# Patient Record
Sex: Male | Born: 1940 | Race: White | Hispanic: No | Marital: Married | Smoking: Former smoker
Health system: Southern US, Community
[De-identification: ages and names within clinical notes are randomized; demographics above are authoritative.]

## PROBLEM LIST (undated history)

## (undated) DIAGNOSIS — I10 Essential (primary) hypertension: Secondary | ICD-10-CM

## (undated) DIAGNOSIS — I2510 Atherosclerotic heart disease of native coronary artery without angina pectoris: Secondary | ICD-10-CM

## (undated) DIAGNOSIS — F0390 Unspecified dementia without behavioral disturbance: Secondary | ICD-10-CM

## (undated) DIAGNOSIS — E119 Type 2 diabetes mellitus without complications: Secondary | ICD-10-CM

## (undated) DIAGNOSIS — I639 Cerebral infarction, unspecified: Secondary | ICD-10-CM

## (undated) HISTORY — PX: CARPAL TUNNEL RELEASE: SHX101

## (undated) HISTORY — PX: BACK SURGERY: SHX140

---

## 2014-04-17 ENCOUNTER — Ambulatory Visit
Admit: 2014-04-17 | Disposition: A | Discharge status: Home / Self Care | Payer: Self-pay | Attending: Internal Medicine | Admitting: Internal Medicine

## 2015-03-20 ENCOUNTER — Ambulatory Visit
Admission: EM | Admit: 2015-03-20 | Discharge: 2015-03-20 | Disposition: A | Discharge status: Home / Self Care | Payer: Medicare Other | Attending: Family Medicine | Admitting: Family Medicine

## 2015-03-20 ENCOUNTER — Encounter: Payer: Self-pay | Admitting: *Deleted

## 2015-03-20 DIAGNOSIS — E1122 Type 2 diabetes mellitus with diabetic chronic kidney disease: Secondary | ICD-10-CM | POA: Diagnosis not present

## 2015-03-20 DIAGNOSIS — N189 Chronic kidney disease, unspecified: Secondary | ICD-10-CM | POA: Insufficient documentation

## 2015-03-20 DIAGNOSIS — Z87891 Personal history of nicotine dependence: Secondary | ICD-10-CM | POA: Diagnosis not present

## 2015-03-20 DIAGNOSIS — R0981 Nasal congestion: Secondary | ICD-10-CM | POA: Diagnosis present

## 2015-03-20 DIAGNOSIS — I2510 Atherosclerotic heart disease of native coronary artery without angina pectoris: Secondary | ICD-10-CM | POA: Insufficient documentation

## 2015-03-20 DIAGNOSIS — I129 Hypertensive chronic kidney disease with stage 1 through stage 4 chronic kidney disease, or unspecified chronic kidney disease: Secondary | ICD-10-CM | POA: Insufficient documentation

## 2015-03-20 DIAGNOSIS — B349 Viral infection, unspecified: Secondary | ICD-10-CM | POA: Insufficient documentation

## 2015-03-20 DIAGNOSIS — J029 Acute pharyngitis, unspecified: Secondary | ICD-10-CM | POA: Diagnosis not present

## 2015-03-20 HISTORY — DX: Atherosclerotic heart disease of native coronary artery without angina pectoris: I25.10

## 2015-03-20 HISTORY — DX: Type 2 diabetes mellitus without complications: E11.9

## 2015-03-20 HISTORY — DX: Essential (primary) hypertension: I10

## 2015-03-20 LAB — RAPID INFLUENZA A&B ANTIGENS (ARMC ONLY): INFLUENZA B (ARMC): NEGATIVE

## 2015-03-20 LAB — RAPID INFLUENZA A&B ANTIGENS: Influenza A (ARMC): NEGATIVE

## 2015-03-20 LAB — RAPID STREP SCREEN (MED CTR MEBANE ONLY): STREPTOCOCCUS, GROUP A SCREEN (DIRECT): NEGATIVE

## 2015-03-20 MED ORDER — OSELTAMIVIR PHOSPHATE 30 MG PO CAPS: 30.0000 mg | ORAL_CAPSULE | Freq: Two times a day (BID) | ORAL | Status: AC

## 2015-03-20 NOTE — Discharge Instructions (Signed)
Take medication as prescribed. Rest. Drink plenty of fluids.   Follow up with your primary care physician this week as needed. Return to Urgent care for new or worsening concerns.    Viral Infections A viral infection can be caused by different types of viruses.Most viral infections are not serious and resolve on their own. However, some infections may cause severe symptoms and may lead to further complications. SYMPTOMS Viruses can frequently cause:  Minor sore throat.  Aches and pains.  Headaches.  Runny nose.  Different types of rashes.  Watery eyes.  Tiredness.  Cough.  Loss of appetite.  Gastrointestinal infections, resulting in nausea, vomiting, and diarrhea. These symptoms do not respond to antibiotics because the infection is not caused by bacteria. However, you might catch a bacterial infection following the viral infection. This is sometimes called a "superinfection." Symptoms of such a bacterial infection may include:  Worsening sore throat with pus and difficulty swallowing.  Swollen neck glands.  Chills and a high or persistent fever.  Severe headache.  Tenderness over the sinuses.  Persistent overall ill feeling (malaise), muscle aches, and tiredness (fatigue).  Persistent cough.  Yellow, green, or brown mucus production with coughing. HOME CARE INSTRUCTIONS   Only take over-the-counter or prescription medicines for pain, discomfort, diarrhea, or fever as directed by your caregiver.  Drink enough water and fluids to keep your urine clear or pale yellow. Sports drinks can provide valuable electrolytes, sugars, and hydration.  Get plenty of rest and maintain proper nutrition. Soups and broths with crackers or rice are fine. SEEK IMMEDIATE MEDICAL CARE IF:   You have severe headaches, shortness of breath, chest pain, neck pain, or an unusual rash.  You have uncontrolled vomiting, diarrhea, or you are unable to keep down fluids.  You or your child  has an oral temperature above 102 F (38.9 C), not controlled by medicine.  Your baby is older than 3 months with a rectal temperature of 102 F (38.9 C) or higher.  Your baby is 18 months old or younger with a rectal temperature of 100.4 F (38 C) or higher. MAKE SURE YOU:   Understand these instructions.  Will watch your condition.  Will get help right away if you are not doing well or get worse.   This information is not intended to replace advice given to you by your health care provider. Make sure you discuss any questions you have with your health care provider.   Document Released: 10/10/2004 Document Revised: 03/25/2011 Document Reviewed: 06/08/2014 Elsevier Interactive Patient Education [employer].

## 2015-03-20 NOTE — ED Provider Notes (Signed)
[facility]  ____________________________________________  Time seen: Approximately 8:18 PM  I have reviewed the triage vital signs and the nursing notes.   HISTORY  Chief Complaint Nasal Congestion; Sore Throat; Fatigue; and Cough  HPI Per Notaro is a 75 y.o. male presents with wife at bedside for the complaints of 1 day history of runny nose, nasal congestion, cough, sore throat with chills since yesterday afternoon. Patient reports that just prior to symptom onset he was around a friend that was diagnosed positive influenza. Patient states that he has been in close contact with a friend including driving to and from [place] just prior to his symptom onset. Patient reports that his friend was diagnosed positive within the last few days. Denies known fevers but reports that he is felt that he had a fever. Reports continues to eat and drink well. States has been taking over-the-counter Tylenol today. Denies other complaints.  Denies chest pain, shortness of breath, abdominal pain, dizziness, neck or back pain, weakness or rash. Denies dysuria.   Past Medical History  Diagnosis Date  . Coronary artery disease   . Diabetes mellitus without complication ([place])   . Hypertension    chronic renal insufficiency  There are no active problems to display for this patient.   Past Surgical History  Procedure Laterality Date  . Back surgery    . Carpal tunnel release Bilateral     Current Outpatient Rx  Name  Route  Sig  Dispense  Refill  . metoprolol succinate (TOPROL-XL) 25 MG 24 hr tablet   Oral   Take 25 mg by mouth daily.         . rivaroxaban (XARELTO) 10 MG TABS tablet   Oral   Take 10 mg by mouth daily.         Marland Kitchen torsemide (DEMADEX) 20 MG tablet   Oral   Take 20 mg by mouth 2 (two) times daily.         .             Allergies Codeine  History reviewed. No pertinent family history.  Social History Social History  Substance Use Topics  .  Smoking status: Former Research scientist (life sciences)  . Smokeless tobacco: None  . Alcohol Use: Yes    Review of Systems Constitutional: Positive chills. Eyes: No visual changes. ENT: Positive runny nose, nasal congestion, sore throat and cough. Cardiovascular: Denies chest pain. Respiratory: Denies shortness of breath. Gastrointestinal: No abdominal pain.  No nausea, no vomiting.  No diarrhea.  No constipation. Genitourinary: Negative for dysuria. Musculoskeletal: Negative for back pain. Skin: Negative for rash. Neurological: Negative for headaches, focal weakness or numbness.  10-point ROS otherwise negative.  ____________________________________________   PHYSICAL EXAM:  VITAL SIGNS: ED Triage Vitals  Enc Vitals Group     BP 03/20/15 1858 155/86 mmHg     Pulse Rate 03/20/15 1858 79     Resp 03/20/15 1858 20     Temp 03/20/15 1858 98.9 F (37.2 C)     Temp Source 03/20/15 1858 Oral     SpO2 03/20/15 1858 97 %     Weight 03/20/15 1858 209 lb (94.802 kg)     Height 03/20/15 1858 5\' 9"  (1.753 m)     Head Cir --      Peak Flow --      Pain Score --      Pain Loc --      Pain Edu? --      Excl. in [place]? --  Constitutional: Alert and oriented. Well appearing and in no acute distress. Eyes: Conjunctivae are normal. PERRL. EOMI. Head: Atraumatic. No sinus tenderness to palpation. No swelling. No erythema.  Ears: no erythema, normal TMs bilaterally.   Nose:Nasal congestion with clear rhinorrhea  Mouth/Throat: Mucous membranes are moist. Mild pharyngeal erythema. No tonsillar swelling or exudate.  Neck: No stridor.  No cervical spine tenderness to palpation. Hematological/Lymphatic/Immunilogical: No cervical lymphadenopathy. Cardiovascular: Normal rate, regular rhythm. Grossly normal heart sounds.  Good peripheral circulation. Respiratory: Normal respiratory effort.  No retractions. Lungs CTAB.No wheezes, rales or rhonchi. Good air movement.  Gastrointestinal: Soft and nontender. Normal Bowel  sounds. No CVA tenderness. Musculoskeletal: No lower or upper extremity tenderness nor edema. No cervical, thoracic or lumbar tenderness to palpation. Bilateral pedal pulses palpated and equal. Neurologic:  Normal speech and language. No gross focal neurologic deficits are appreciated. No gait instability. Skin:  Skin is warm, dry and intact. No rash noted. Psychiatric: Mood and affect are normal. Speech and behavior are normal.  ____________________________________________   LABS (all labs ordered are listed, but only abnormal results are displayed)  Labs Reviewed  RAPID INFLUENZA A&B ANTIGENS ([facility] ONLY)  RAPID STREP SCREEN (NOT AT [facility])  CULTURE, GROUP A STREP [facility])    INITIAL IMPRESSION / ASSESSMENT AND PLAN / ED COURSE  Pertinent labs & imaging results that were available during my care of the patient were reviewed by me and considered in my medical decision making (see chart for details).   Very well-appearing patient. No acute distress. Wife at bedside. One day history of runny nose, nasal congestion, sore throat and cough with positive influenza exposure just prior to symptom onset. Lungs clear throughout. Abdomen soft and nontender. Very well-appearing. Moist mucous membranes. Quick strep negative, will culture. Influenza negative. However patient's subjective information and clinical appearance as well as positive influenza exposure just prior to symptom onset suggestive of influenza. Discussed this with patient and spouse. Discussed treatment with oral Tamiflu with patient and spouse and they request prescription for oral Tamiflu. Discussed indication, risks and benefits of medications with patient. Patient with chronic renal insufficiency, will treat with renal dose of Tamiflu. Labs in epic reviewed. Tamiflu 30 mg twice a day 5 days. Encouraged rest, fluids, when necessary Tylenol as needed. Encourage close monitoring of symptoms and PCP follow up.    Discussed follow up with  Primary care physician this week. Discussed follow up and return parameters including no resolution or any worsening concerns. Patient verbalized understanding and agreed to plan.   ____________________________________________   FINAL CLINICAL IMPRESSION(S) / ED DIAGNOSES  Final diagnoses:  Viral illness      Note: This dictation was prepared with Dragon dictation along with smaller phrase technology. Any transcriptional errors that result from this process are unintentional.    Marylene Land, NP 03/20/15 2027

## 2015-03-20 NOTE — ED Notes (Signed)
Exposed to a person recently dx with flu, now c/o fatigue, non-productive cough, runny nose, and sore throat since yesterday.

## 2015-03-23 LAB — CULTURE, GROUP A STREP (THRC)

## 2016-12-25 ENCOUNTER — Ambulatory Visit
Admission: RE | Admit: 2016-12-25 | Discharge: 2016-12-25 | Disposition: A | Discharge status: Home / Self Care | Payer: Medicare Other | Source: Ambulatory Visit | Attending: Family Medicine | Admitting: Family Medicine

## 2016-12-25 ENCOUNTER — Other Ambulatory Visit: Payer: Self-pay | Admitting: Family Medicine

## 2016-12-25 DIAGNOSIS — M7122 Synovial cyst of popliteal space [Baker], left knee: Secondary | ICD-10-CM | POA: Insufficient documentation

## 2016-12-25 DIAGNOSIS — M7989 Other specified soft tissue disorders: Secondary | ICD-10-CM | POA: Insufficient documentation

## 2016-12-27 ENCOUNTER — Other Ambulatory Visit: Payer: Self-pay | Admitting: Sports Medicine

## 2016-12-27 DIAGNOSIS — M7122 Synovial cyst of popliteal space [Baker], left knee: Secondary | ICD-10-CM

## 2016-12-30 ENCOUNTER — Ambulatory Visit
Admission: RE | Admit: 2016-12-30 | Discharge: 2016-12-30 | Disposition: A | Discharge status: Home / Self Care | Payer: Medicare Other | Source: Ambulatory Visit | Attending: Sports Medicine | Admitting: Sports Medicine

## 2016-12-30 DIAGNOSIS — M7122 Synovial cyst of popliteal space [Baker], left knee: Secondary | ICD-10-CM | POA: Diagnosis not present

## 2016-12-30 MED ORDER — METHYLPREDNISOLONE ACETATE 80 MG/ML IJ SUSP
INTRAMUSCULAR | Status: AC
  Filled 2016-12-30: qty 2

## 2016-12-30 MED ORDER — METHYLPREDNISOLONE ACETATE 40 MG/ML INJ SUSP (RADIOLOG
120.0000 mg | Freq: Once | INTRAMUSCULAR | Status: AC
  Administered 2016-12-30: 120 mg via INTRA_ARTICULAR

## 2016-12-30 MED ORDER — BUPIVACAINE HCL (PF) 0.25 % IJ SOLN
INTRAMUSCULAR | Status: AC
  Filled 2016-12-30: qty 30

## 2016-12-30 MED ORDER — BUPIVACAINE HCL (PF) 0.25 % IJ SOLN
7.0000 mL | Freq: Once | INTRAMUSCULAR | Status: AC
  Administered 2016-12-30: 7 mL
  Filled 2016-12-30: qty 10

## 2017-09-19 ENCOUNTER — Other Ambulatory Visit: Payer: Self-pay

## 2017-09-19 ENCOUNTER — Encounter: Payer: Self-pay | Admitting: Emergency Medicine

## 2017-09-19 ENCOUNTER — Ambulatory Visit: Payer: Medicare Other

## 2017-09-19 ENCOUNTER — Ambulatory Visit
Admission: EM | Admit: 2017-09-19 | Discharge: 2017-09-19 | Disposition: A | Discharge status: Home / Self Care | Payer: Medicare Other | Attending: Physician Assistant | Admitting: Physician Assistant

## 2017-09-19 DIAGNOSIS — Z885 Allergy status to narcotic agent status: Secondary | ICD-10-CM | POA: Insufficient documentation

## 2017-09-19 DIAGNOSIS — R05 Cough: Secondary | ICD-10-CM | POA: Diagnosis not present

## 2017-09-19 DIAGNOSIS — R059 Cough, unspecified: Secondary | ICD-10-CM

## 2017-09-19 DIAGNOSIS — J209 Acute bronchitis, unspecified: Secondary | ICD-10-CM | POA: Diagnosis not present

## 2017-09-19 DIAGNOSIS — Z79899 Other long term (current) drug therapy: Secondary | ICD-10-CM | POA: Insufficient documentation

## 2017-09-19 DIAGNOSIS — I10 Essential (primary) hypertension: Secondary | ICD-10-CM | POA: Insufficient documentation

## 2017-09-19 DIAGNOSIS — Z87891 Personal history of nicotine dependence: Secondary | ICD-10-CM | POA: Insufficient documentation

## 2017-09-19 DIAGNOSIS — J069 Acute upper respiratory infection, unspecified: Secondary | ICD-10-CM

## 2017-09-19 DIAGNOSIS — Z7901 Long term (current) use of anticoagulants: Secondary | ICD-10-CM | POA: Insufficient documentation

## 2017-09-19 DIAGNOSIS — E119 Type 2 diabetes mellitus without complications: Secondary | ICD-10-CM | POA: Diagnosis not present

## 2017-09-19 DIAGNOSIS — B9789 Other viral agents as the cause of diseases classified elsewhere: Secondary | ICD-10-CM | POA: Diagnosis not present

## 2017-09-19 DIAGNOSIS — I2510 Atherosclerotic heart disease of native coronary artery without angina pectoris: Secondary | ICD-10-CM | POA: Insufficient documentation

## 2017-09-19 MED ORDER — PREDNISONE 10 MG PO TABS: 30.0000 mg | ORAL_TABLET | Freq: Every day | ORAL | 0 refills | Status: AC

## 2017-09-19 MED ORDER — HYDROCOD POLST-CPM POLST ER 10-8 MG/5ML PO SUER: 5.0000 mL | Freq: Two times a day (BID) | ORAL | 0 refills | Status: AC | PRN

## 2017-09-19 NOTE — ED Triage Notes (Signed)
Pt c/o cough and congestion. Started over a week ago. Productive cough with yellow and bloody sputum. He is wheezing.

## 2017-09-19 NOTE — ED Provider Notes (Signed)
[facility]    CSN: [id number] Arrival date & time: 09/19/17  1510     History   Chief Complaint Chief Complaint  Patient presents with  . Cough    HPI Eduardo Richards is a 77 y.o. male. Patient presents today with his wife for productive cough of greater than one week. He states that his sputum is yellow-green and he noticed specks of blood last night. Denies chest pain, shortness of breath and breathing difficulty. Denies fever, fatigue, nasal congestion, sinus pain, and body aches. Wife states that he has been wheezing at night. Patient has no history of COPD, asthma or CHF. He is diabetic and has CAD. He has take OTC Claritin for symptoms without relief. He has no other concerns or complaints today.  HPI  Past Medical History:  Diagnosis Date  . Coronary artery disease   . Diabetes mellitus without complication ([place])   . Hypertension     There are no active problems to display for this patient.   Past Surgical History:  Procedure Laterality Date  . BACK SURGERY    . CARPAL TUNNEL RELEASE Bilateral        Home Medications    Prior to Admission medications   Medication Sig Start Date End Date Taking? Authorizing Provider  metoprolol succinate (TOPROL-XL) 25 MG 24 hr tablet Take 25 mg by mouth daily.   Yes [provider]  torsemide (DEMADEX) 20 MG tablet Take 20 mg by mouth 2 (two) times daily.   Yes [provider]  chlorpheniramine-HYDROcodone (TUSSIONEX PENNKINETIC ER) 10-8 MG/5ML SUER Take 5 mLs by mouth every 12 (twelve) hours as needed for up to 10 days for cough. 09/19/17 09/29/17  Danton Clap, PA-C  predniSONE (DELTASONE) 10 MG tablet Take 3 tablets (30 mg total) by mouth daily with breakfast for 5 days. 09/19/17 09/24/17  Danton Clap, PA-C  rivaroxaban (XARELTO) 10 MG TABS tablet Take 10 mg by mouth daily.    [provider]  warfarin (COUMADIN) 4 MG tablet Take by mouth.    [provider]    Family  History Family History  Problem Relation Age of Onset  . Cancer Mother   . Cancer Father     Social History Social History   Tobacco Use  . Smoking status: Former Research scientist (life sciences)  . Smokeless tobacco: Never Used  Substance Use Topics  . Alcohol use: Yes    Comment: occasionally  . Drug use: Never     Allergies   Codeine   Review of Systems Review of Systems  Constitutional: Negative for appetite change, chills, fatigue and fever.  HENT: Negative for congestion, ear pain, rhinorrhea, sinus pressure and sinus pain.   Respiratory: Positive for cough and wheezing. Negative for apnea, chest tightness and shortness of breath.   Cardiovascular: Negative for chest pain and leg swelling.  Gastrointestinal: Negative for abdominal pain, nausea and vomiting.  Musculoskeletal: Negative for arthralgias and myalgias.  Skin: Negative for rash.  Neurological: Negative for dizziness.  Hematological: Negative for adenopathy.     Physical Exam Triage Vital Signs ED Triage Vitals  Enc Vitals Group     BP 09/19/17 1612 (!) 151/87     Pulse Rate 09/19/17 1612 71     Resp 09/19/17 1612 17     Temp 09/19/17 1612 98.6 F (37 C)     Temp Source 09/19/17 1612 Oral     SpO2 09/19/17 1612 95 %     Weight 09/19/17 1611 198  lb (89.8 kg)     Height 09/19/17 1611 5\' 9"  (1.753 m)     Head Circumference --      Peak Flow --      Pain Score 09/19/17 1610 0     Pain Loc --      Pain Edu? --      Excl. in [place]? --    No data found.  Updated Vital Signs BP (!) 151/87 (BP Location: Left Arm)   Pulse 71   Temp 98.6 F (37 C) (Oral)   Resp 17   Ht 5\' 9"  (1.753 m)   Wt 198 lb (89.8 kg)   SpO2 95%   BMI 29.24 kg/m       Physical Exam  Constitutional: He is oriented to person, place, and time. He appears well-developed and well-nourished. No distress.  HENT:  Head: Normocephalic and atraumatic.  Right Ear: External ear normal.  Left Ear: External ear normal.  Nose: Nose normal.  Mouth/Throat:  Oropharynx is clear and moist.  Eyes: Pupils are equal, round, and reactive to light. Conjunctivae and EOM are normal. No scleral icterus.  Neck: Normal range of motion. Neck supple.  Cardiovascular: Normal rate, regular rhythm and normal heart sounds.  No murmur heard. Pulmonary/Chest: Effort normal and breath sounds normal. No respiratory distress. He has no wheezes. He has no rales.  Lymphadenopathy:    He has no cervical adenopathy.  Neurological: He is alert and oriented to person, place, and time.  Skin: Skin is warm and dry. He is not diaphoretic. No erythema.  Psychiatric: He has a normal mood and affect. His behavior is normal.     UC Treatments / Results  Labs (all labs ordered are listed, but only abnormal results are displayed) Labs Reviewed - No data to display  EKG None  Radiology Dg Chest 2 View  Result Date: 09/19/2017 CLINICAL DATA:  Cough for 2 weeks EXAM: CHEST - 2 VIEW COMPARISON:  None FINDINGS: Normal heart size, mediastinal contours, and pulmonary vascularity. Atherosclerotic calcifications aorta. Lungs low expanded and clear. No pulmonary infiltrate, pleural effusion or pneumothorax. Bones unremarkable. IMPRESSION: No acute abnormalities. Electronically Signed   By: Lavonia Dana M.D.   On: 09/19/2017 18:15    Procedures Procedures (including critical care time)  Medications Ordered in UC Medications - No data to display  Initial Impression / Assessment and Plan / UC Course  I have reviewed the triage vital signs and the nursing notes.  Pertinent labs & imaging results that were available during my care of the patient were reviewed by me and considered in my medical decision making (see chart for details).   Oxygen saturation slightly decreased at 95%. On exam, lungs CTA, but given concern by patient and wife, CXR performed to assess for underlying pneumonia. CXR normal. Patient treated for viral infection. Patient and wife requesting cough medication for  bed time since the cough is keeping him awake. Advised Coricidin HBP during day and tussionex at bed time--use responsibly and do not drive while taking. Begin prednisone today as well. Rest and increase fluids. He should f/u with his  PCP next week for re-examination or sooner for any fever, breathing difficulty or signs of worsening infection.    Final Clinical Impressions(s) / UC Diagnoses   Final diagnoses:  Cough  Viral upper respiratory tract infection  Acute bronchitis, unspecified organism     Discharge Instructions     Your chest x-ray today was normal. There was no sign of pneumonia.  Your lungs were clear on exam. Your condition is consistent with a viral nfection. This is self limited and should resolve over the next 7-10 days. Begin Tussionex twice daily. Do not take if you are going to be driving. Increase fluid intake. Take prednisone to further reduce inflammation and improve your condition. If you have fever, wheezing, breathing difficulty, fatigue, or feel worse in anyway f/u with our office, your PCP or ER    ED Prescriptions    Medication Sig Dispense Auth. Provider   chlorpheniramine-HYDROcodone (TUSSIONEX PENNKINETIC ER) 10-8 MG/5ML SUER Take 5 mLs by mouth every 12 (twelve) hours as needed for up to 10 days for cough. 100 mL Laurene Footman B, PA-C   predniSONE (DELTASONE) 10 MG tablet Take 3 tablets (30 mg total) by mouth daily with breakfast for 5 days. 15 tablet Danton Clap, PA-C     Controlled Substance Prescriptions [place] Controlled Substance Registry consulted? Yes, I have consulted the [place] Controlled Substances Registry for this patient, and feel the risk/benefit ratio today is favorable for proceeding with this prescription for a controlled substance.   Danton Clap, PA-C 09/20/17 1902

## 2017-09-19 NOTE — Discharge Instructions (Addendum)
Your chest x-ray today was normal. There was no sign of pneumonia. Your lungs were clear on exam. Your condition is consistent with a viral nfection. This is self limited and should resolve over the next 7-10 days. Begin Tussionex twice daily. Do not take if you are going to be driving. Increase fluid intake. Take prednisone to further reduce inflammation and improve your condition. If you have fever, wheezing, breathing difficulty, fatigue, or feel worse in anyway f/u with our office, your PCP or ER

## 2018-08-17 ENCOUNTER — Other Ambulatory Visit: Payer: Self-pay

## 2018-08-17 ENCOUNTER — Ambulatory Visit (INDEPENDENT_AMBULATORY_CARE_PROVIDER_SITE_OTHER): Payer: Medicare Other | Admitting: Urology

## 2018-08-17 ENCOUNTER — Encounter: Payer: Self-pay | Admitting: Urology

## 2018-08-17 VITALS — BP 151/83 | HR 78 | Ht 69.0 in | Wt 198.0 lb

## 2018-08-17 DIAGNOSIS — R310 Gross hematuria: Secondary | ICD-10-CM | POA: Diagnosis not present

## 2018-08-17 LAB — MICROSCOPIC EXAMINATION: WBC, UA: >30 /hpf — AB (ref 0–5)

## 2018-08-17 LAB — URINALYSIS, COMPLETE
Bilirubin, UA: NEGATIVE
Glucose, UA: NEGATIVE
Ketones, UA: NEGATIVE
Nitrite, UA: POSITIVE — AB
Specific Gravity, UA: 1.02 (ref 1.005–1.030)
Urobilinogen, Ur: 0.2 mg/dL (ref 0.2–1.0)
pH, UA: 7 (ref 5.0–7.5)

## 2018-08-17 MED ORDER — AMOXICILLIN-POT CLAVULANATE 875-125 MG PO TABS: 1.0000 | ORAL_TABLET | Freq: Two times a day (BID) | ORAL | 0 refills | Status: DC

## 2018-08-17 NOTE — Addendum Note (Signed)
Addended by: Verlene Mayer A on: 08/17/2018 01:16 PM   Modules accepted: Orders

## 2018-08-17 NOTE — Progress Notes (Signed)
08/17/2018 11:40 AM   Eduardo Richards November 25, 1940 [id number]  Referring provider: [name], [place],  [place] [address]  Chief Complaint  Patient presents with  . Hematuria    HPI: The patient was followed at [facility] and was on Myrbetriq the Oxytrol patch and finasteride.  He had urodynamics in 2011.  He had been on every 3 months maintenance therapy of percutaneous tibial nerve stimulation.  According to medical records he had advancing dementia and has had a stroke.  There was question whether or not Botox could be utilized in the future and perhaps sent to [facility]  The patient recently saw his primary care physician for for 5-day history of gross hematuria.  The history was difficult.  The patient is on Myrbetriq and Proscar but not the patch.  Percutaneous tibial nerve stimulation was stopped due to lack of efficacy.  He does have advancing dementia.  He does take daily blood thinners with Coumadin and aspirin.  He has never smoked.  Based upon the history of a urine culture he was recently started on Keflex but never started it yet  Sometimes he voluntarily voids and other times he just uses depends.  He can walk but was in a wheelchair today.  History was primarily obtained through his wife  Modifying factors: There are no other modifying factors  Associated signs and symptoms: There are no other associated signs and symptoms Aggravating and relieving factors: There are no other aggravating or relieving factors Severity: Moderate Duration: Persistent     PMH: Past Medical History:  Diagnosis Date  . Coronary artery disease   . Diabetes mellitus without complication ([place])   . Hypertension     Surgical History: Past Surgical History:  Procedure Laterality Date  . BACK SURGERY    . CARPAL TUNNEL RELEASE Bilateral     Home Medications:  Allergies as of 08/17/2018      Reactions   Clindamycin Rash   Glipizide Other (See  Comments)   Reported confusion while taking the medication   Tamsulosin Other (See Comments)   ED ED ED ED   Codeine Nausea And Vomiting      Medication List       Accurate as of August 17, 2018 11:40 AM. If you have any questions, ask your nurse or doctor.        STOP taking these medications   rivaroxaban 10 MG Tabs tablet Commonly known as: XARELTO Stopped by: [name], MD     TAKE these medications   allopurinol 100 MG tablet Commonly known as: ZYLOPRIM Take by mouth.   amoxicillin-clavulanate 875-125 MG tablet Commonly known as: AUGMENTIN Take 1 tablet by mouth every 12 (twelve) hours. Started by: [name], MD   aspirin 81 MG chewable tablet Chew by mouth.   atorvastatin 40 MG tablet Commonly known as: LIPITOR Take by mouth.   buPROPion 150 MG 24 hr tablet Commonly known as: WELLBUTRIN XL Take by mouth.   diltiazem 180 MG 24 hr capsule Commonly known as: CARDIZEM CD Take by mouth.   finasteride 5 MG tablet Commonly known as: PROSCAR Take by mouth.   ketoconazole 2 % shampoo Commonly known as: NIZORAL APPLY ON THE SKIN DAILY TO [facility] UPPER BODY AND SCALP X 2 WEEKS THEN ONCE A MONTH AS MAINTENCE   metoprolol succinate 25 MG 24 hr tablet Commonly known as: TOPROL-XL Take 25 mg by mouth daily.   Myrbetriq 50 MG Tb24 tablet  Generic drug: mirabegron ER TAKE 1 TABLET BY MOUTH EVERY DAY   torsemide 20 MG tablet Commonly known as: DEMADEX Take 20 mg by mouth 2 (two) times daily.   Tradjenta 5 MG Tabs tablet Generic drug: linagliptin Take 5 mg by mouth daily.   warfarin 4 MG tablet Commonly known as: COUMADIN Take by mouth.       Allergies:  Allergies  Allergen Reactions  . Clindamycin Rash  . Glipizide Other (See Comments)    Reported confusion while taking the medication  . Tamsulosin Other (See Comments)    ED ED ED ED   . Codeine Nausea And Vomiting    Family History: Family History  Problem Relation Age  of Onset  . Cancer Mother   . Cancer Father     Social History:  reports that he has quit smoking. He has never used smokeless tobacco. He reports current alcohol use. He reports that he does not use drugs.  ROS: UROLOGY Frequent Urination?: No Hard to postpone urination?: Yes Burning/pain with urination?: No Get up at night to urinate?: No Leakage of urine?: No Urine stream starts and stops?: Yes Trouble starting stream?: No Do you have to strain to urinate?: No Blood in urine?: Yes Urinary tract infection?: Yes Sexually transmitted disease?: No Injury to kidneys or bladder?: No Painful intercourse?: No Weak stream?: No Erection problems?: No Penile pain?: No  Gastrointestinal Nausea?: No Vomiting?: No Indigestion/heartburn?: No Diarrhea?: No Constipation?: No  Constitutional Fever: No Night sweats?: No Weight loss?: No Fatigue?: No  Skin Skin rash/lesions?: No Itching?: No  Eyes Blurred vision?: No Double vision?: No  Ears/Nose/Throat Sore throat?: No Sinus problems?: No  Hematologic/Lymphatic Swollen glands?: No Easy bruising?: No  Cardiovascular Leg swelling?: No Chest pain?: No  Respiratory Cough?: No Shortness of breath?: No  Endocrine Excessive thirst?: No  Musculoskeletal Back pain?: No Joint pain?: No  Neurological Headaches?: No  Psychologic Depression?: No Anxiety?: No  Physical Exam: BP (!) 151/83   Pulse 78   Ht 5\' 9"  (1.753 m)   Wt 198 lb (89.8 kg)   BMI 29.24 kg/m   Constitutional:  Alert and oriented, No acute distress. HEENT: [place] AT, moist mucus membranes.  Trachea midline, no masses. Cardiovascular: No clubbing, cyanosis, or edema. Respiratory: Normal respiratory effort, no increased work of breathing. GI: Abdomen is soft, nontender, nondistended, no abdominal masses GU: No CVA tenderness.  No bladder tenderness Skin: No rashes, bruises or suspicious lesions. Lymph: No cervical or inguinal adenopathy.  Neurologic: Grossly intact, no focal deficits, moving all 4 extremities. Psychiatric: Normal mood and affect.  Laboratory Data: No results found for: WBC, HGB, HCT, MCV, PLT  No results found for: CREATININE  No results found for: PSA  No results found for: TESTOSTERONE  No results found for: HGBA1C  Urinalysis No results found for: COLORURINE, APPEARANCEUR, LABSPEC, PHURINE, GLUCOSEU, HGBUR, BILIRUBINUR, KETONESUR, PROTEINUR, UROBILINOGEN, NITRITE, LEUKOCYTESUR  Pertinent Imaging: No recent x-rays  Assessment & Plan: We tried to obtain the urine culture results.  I told him to fill the Keflex prescription.  I think it would be best to order a CT scan hematuria work-up and call if the results are abnormal.    We did fine with a positive culture and Keflex was not appropriate.  We called in Augmentin.  Hematuria CT scan ordered I will call if needed.  Otherwise see me in 6 weeks.  They would like to resume follow-up here.  We did speak to Botox and we will  not be offering this in the future.  He does not need cystoscopy at this stage.  He has exhausted his treatment algorithm in my opinion  There are no diagnoses linked to this encounter.  Return in about 6 weeks (around 09/28/2018) for MD follow up.  [name], MD  [place] [address] [place], [place] [address] [phone]

## 2018-08-19 ENCOUNTER — Other Ambulatory Visit: Payer: Self-pay

## 2018-08-19 DIAGNOSIS — R310 Gross hematuria: Secondary | ICD-10-CM

## 2018-08-24 ENCOUNTER — Other Ambulatory Visit: Payer: Self-pay | Admitting: Urology

## 2018-08-24 ENCOUNTER — Other Ambulatory Visit
Admission: RE | Admit: 2018-08-24 | Discharge: 2018-08-24 | Disposition: A | Discharge status: Home / Self Care | Payer: Medicare Other | Source: Ambulatory Visit | Attending: Urology | Admitting: Urology

## 2018-08-24 ENCOUNTER — Other Ambulatory Visit: Payer: Self-pay

## 2018-08-24 ENCOUNTER — Ambulatory Visit
Admission: RE | Admit: 2018-08-24 | Discharge: 2018-08-24 | Disposition: A | Discharge status: Home / Self Care | Payer: Medicare Other | Source: Ambulatory Visit | Attending: Urology | Admitting: Urology

## 2018-08-24 DIAGNOSIS — R310 Gross hematuria: Secondary | ICD-10-CM

## 2018-08-24 LAB — CREATININE, SERUM
Creatinine, Ser: 2.77 mg/dL — ABNORMAL HIGH (ref 0.61–1.24)
GFR calc Af Amer: 24 mL/min — ABNORMAL LOW (ref >60)
GFR calc non Af Amer: 21 mL/min — ABNORMAL LOW (ref >60)

## 2018-08-24 LAB — BUN: BUN: 39 mg/dL — ABNORMAL HIGH (ref 8–23)

## 2018-08-25 ENCOUNTER — Telehealth: Payer: Self-pay | Admitting: Urology

## 2018-08-25 NOTE — Telephone Encounter (Signed)
Patient's wife is calling asking for his CT results and she is not listed on any kind of DPR as a contact per to speak with. He has dementia and they are not able to come in and sign a form right now and we did not give the form at check in to fill out. Can you review the results and let us know if we can give them to her. She is coming to the [facility] office 08-26-18 to sign one so that way we can speak with her.   Thanks, Sharyn Lull

## 2018-08-27 NOTE — Telephone Encounter (Signed)
You may provide him to the family as long as it meets the criteria of confidence she allergy

## 2018-08-28 ENCOUNTER — Telehealth: Payer: Self-pay | Admitting: Urology

## 2018-08-28 ENCOUNTER — Other Ambulatory Visit: Payer: Self-pay | Admitting: Urology

## 2018-08-28 DIAGNOSIS — N281 Cyst of kidney, acquired: Secondary | ICD-10-CM

## 2018-08-28 NOTE — Telephone Encounter (Signed)
I have spoken with Mrs. Groesbeck regarding her husband's CT result and she would like the results sent to her husband's nephrologist.  She will come to the [facility] office either today or Monday to sign a Medical release form.  I did explain that the areas in his kidneys could not be readily identified because contrast could not be used and they were likely benign cysts, but that we will order a RUS in 6 months for monitoring purposes.

## 2018-09-08 ENCOUNTER — Ambulatory Visit: Payer: Self-pay | Admitting: Urology

## 2018-09-14 ENCOUNTER — Ambulatory Visit: Payer: Self-pay | Admitting: Urology

## 2018-10-05 ENCOUNTER — Ambulatory Visit (INDEPENDENT_AMBULATORY_CARE_PROVIDER_SITE_OTHER): Payer: Medicare Other | Admitting: Urology

## 2018-10-05 ENCOUNTER — Encounter: Payer: Self-pay | Admitting: Urology

## 2018-10-05 ENCOUNTER — Other Ambulatory Visit: Payer: Self-pay

## 2018-10-05 VITALS — BP 170/71 | HR 75 | Ht 69.0 in | Wt 197.0 lb

## 2018-10-05 DIAGNOSIS — N3946 Mixed incontinence: Secondary | ICD-10-CM | POA: Diagnosis not present

## 2018-10-05 MED ORDER — FINASTERIDE 5 MG PO TABS: 5.0000 mg | ORAL_TABLET | Freq: Every day | ORAL | 11 refills | Status: AC

## 2018-10-05 NOTE — Progress Notes (Signed)
10/05/2018 11:09 AM   Eduardo Richards Eduardo Richards Oct 14, 1940 [id number]  Referring provider: [name], [name],  [place] [address]  Chief Complaint  Patient presents with  . Follow-up    HPI: The patient was followed at [facility] and was on Myrbetriq the Oxytrol patch and finasteride.  He had urodynamics in 2011.  He had been on every 3 months maintenance therapy of percutaneous tibial nerve stimulation.  According to medical records he had advancing dementia and has had a stroke.  There was question whether or not Botox could be utilized in the future and perhaps sent to [facility]  The patient recently saw his primary care physician for for 5-day history of gross hematuria.  The history was difficult.  The patient is on Myrbetriq and Proscar but not the patch.  Percutaneous tibial nerve stimulation was stopped due to lack of efficacy.  He does have advancing dementia.  He does take daily blood thinners with Coumadin and aspirin.  He has never smoked.  Based upon the history of a urine culture he was recently started on Keflex but never started it yet  Sometimes he voluntarily voids and other times he just uses depends.  He can walk but was in a wheelchair today.  History was primarily obtained through his wife    We tried to obtain the urine culture results.  I told him to fill the Keflex prescription.  I think it would be best to order a CT scan hematuria work-up and call if the results are abnormal.    We did fine with a positive culture and Keflex was not appropriate.  We called in Augmentin.  Hematuria CT scan ordered I will call if needed.  Otherwise see me in 6 weeks.  They would like to resume follow-up here.  We did speak to Botox and we will not be offering this in the future.  He does not need cystoscopy at this stage.  He has exhausted his treatment algorithm in my opinion  Today Patient had a CT scan in August 24, 2018.  He had several small  exophytic lesions of right kidney likely cysts but dye could not be given.  He had a large prostate.   Continence stable.  Family stop Myrbetriq not working.  Clinically not infected.  I reviewed renal cysts and were not can order an ultrasound and it would not change management.  She wondered about his enlarged prostate and she understands is not an operation for this.  We talked about aggravating factors.  We talked about expectations.  PMH: Past Medical History:  Diagnosis Date  . Coronary artery disease   . Diabetes mellitus without complication ([place])   . Hypertension     Surgical History: Past Surgical History:  Procedure Laterality Date  . BACK SURGERY    . CARPAL TUNNEL RELEASE Bilateral     Home Medications:  Allergies as of 10/05/2018      Reactions   Clindamycin Rash   Glipizide Other (See Comments)   Reported confusion while taking the medication   Tamsulosin Other (See Comments)   ED ED ED ED   Codeine Nausea And Vomiting      Medication List       Accurate as of October 05, 2018 11:09 AM. If you have any questions, ask your nurse or doctor.        STOP taking these medications   amoxicillin-clavulanate 875-125 MG tablet Commonly known as: AUGMENTIN Stopped by:  [name], MD     TAKE these medications   allopurinol 100 MG tablet Commonly known as: ZYLOPRIM Take by mouth.   aspirin 81 MG chewable tablet Chew by mouth.   atorvastatin 40 MG tablet Commonly known as: LIPITOR Take by mouth.   buPROPion 150 MG 24 hr tablet Commonly known as: WELLBUTRIN XL Take by mouth.   diltiazem 180 MG 24 hr capsule Commonly known as: CARDIZEM CD Take by mouth.   finasteride 5 MG tablet Commonly known as: PROSCAR Take by mouth.   ketoconazole 2 % shampoo Commonly known as: NIZORAL APPLY ON THE SKIN DAILY TO [facility] UPPER BODY AND SCALP X 2 WEEKS THEN ONCE A MONTH AS MAINTENCE   metoprolol succinate 25 MG 24 hr tablet Commonly known as:  TOPROL-XL Take 25 mg by mouth daily.   Myrbetriq 50 MG Tb24 tablet Generic drug: mirabegron ER TAKE 1 TABLET BY MOUTH EVERY DAY   torsemide 20 MG tablet Commonly known as: DEMADEX Take 20 mg by mouth 2 (two) times daily.   Tradjenta 5 MG Tabs tablet Generic drug: linagliptin Take 5 mg by mouth daily.   warfarin 4 MG tablet Commonly known as: COUMADIN Take by mouth.       Allergies:  Allergies  Allergen Reactions  . Clindamycin Rash  . Glipizide Other (See Comments)    Reported confusion while taking the medication  . Tamsulosin Other (See Comments)    ED ED ED ED   . Codeine Nausea And Vomiting    Family History: Family History  Problem Relation Age of Onset  . Cancer Mother   . Cancer Father     Social History:  reports that he has quit smoking. He has never used smokeless tobacco. He reports current alcohol use. He reports that he does not use drugs.  ROS:                                        Physical Exam: BP (!) 170/71   Pulse 75   Ht 5\' 9"  (1.753 m)   Wt 89.4 kg   BMI 29.09 kg/m   Constitutional:  Alert and oriented, No acute distress.   Laboratory Data: No results found for: WBC, HGB, HCT, MCV, PLT  Lab Results  Component Value Date   CREATININE 2.77 (H) 08/24/2018    No results found for: PSA  No results found for: TESTOSTERONE  No results found for: HGBA1C  Urinalysis    Component Value Date/Time   APPEARANCEUR Cloudy (A) 08/17/2018 1320   GLUCOSEU Negative 08/17/2018 1320   BILIRUBINUR Negative 08/17/2018 1320   PROTEINUR 3+ (A) 08/17/2018 1320   NITRITE Positive (A) 08/17/2018 1320   LEUKOCYTESUR 3+ (A) 08/17/2018 1320    Pertinent Imaging:   Assessment & Plan: Mr. Delprado wife wondered about prostate surgery and other therapies and she wonders why a PSA should be ordered or not.  I clarified all these issues.  I renewed the finasteride and will reassess in 1 year  There are no diagnoses  linked to this encounter.  No follow-ups on file.  [name], MD  [place] [address] [place], [place] [address] [phone]

## 2018-10-05 NOTE — Addendum Note (Signed)
Addended by: Verlene Mayer A on: 10/05/2018 11:18 AM   Modules accepted: Orders

## 2018-11-30 ENCOUNTER — Other Ambulatory Visit: Payer: Self-pay | Admitting: Neurology

## 2018-11-30 ENCOUNTER — Other Ambulatory Visit (HOSPITAL_COMMUNITY): Payer: Self-pay | Admitting: Neurology

## 2018-11-30 DIAGNOSIS — G301 Alzheimer's disease with late onset: Secondary | ICD-10-CM

## 2018-11-30 DIAGNOSIS — R4189 Other symptoms and signs involving cognitive functions and awareness: Secondary | ICD-10-CM

## 2018-11-30 DIAGNOSIS — F0280 Dementia in other diseases classified elsewhere without behavioral disturbance: Secondary | ICD-10-CM

## 2018-12-03 ENCOUNTER — Ambulatory Visit: Payer: Medicare Other

## 2018-12-14 ENCOUNTER — Other Ambulatory Visit: Payer: Self-pay

## 2018-12-14 ENCOUNTER — Ambulatory Visit
Admission: RE | Admit: 2018-12-14 | Discharge: 2018-12-14 | Disposition: A | Discharge status: Home / Self Care | Payer: Medicare Other | Source: Ambulatory Visit | Attending: Neurology | Admitting: Neurology

## 2018-12-14 DIAGNOSIS — G301 Alzheimer's disease with late onset: Secondary | ICD-10-CM | POA: Insufficient documentation

## 2018-12-14 DIAGNOSIS — F0280 Dementia in other diseases classified elsewhere without behavioral disturbance: Secondary | ICD-10-CM | POA: Insufficient documentation

## 2019-01-26 ENCOUNTER — Telehealth: Payer: Self-pay | Admitting: Primary Care

## 2019-01-26 NOTE — Telephone Encounter (Signed)
Called patient's listed home number to schedule the Palliative Consult, this number [phone]) has been disconnected.  I found the wife's cell number in our referral information and called that number, with no answer and mailbox was full.  I also found a cell number for the son [name] and called that number, no answer but left message with my contact information.

## 2019-02-02 ENCOUNTER — Telehealth: Payer: Self-pay | Admitting: Primary Care

## 2019-02-02 NOTE — Telephone Encounter (Signed)
Spoke with patient's wife [name] regarding Palliative services and all questions were answered and she was in agreement with this.  I have scheduled a Telephone Consult for 02/10/19 @ 12 Noon.

## 2019-02-10 ENCOUNTER — Other Ambulatory Visit: Payer: Self-pay

## 2019-02-10 ENCOUNTER — Other Ambulatory Visit: Payer: Medicare Other | Admitting: Primary Care

## 2019-02-10 DIAGNOSIS — Z515 Encounter for palliative care: Secondary | ICD-10-CM

## 2019-02-10 NOTE — Progress Notes (Signed)
[facility] Palliative Care Consult Note Telephone: [phone]  Fax: [phone]  TELEHEALTH VISIT STATEMENT Due to the COVID-19 crisis, this visit was done via telemedicine from my office. It was initiated and consented to by this patient and/or family.  PATIENT NAME: Eduardo Richards [address] [place] [address] [id number] (home)  DOB: [birth year] MRN: [id number]  PRIMARY CARE PROVIDER:   [name], [place], [place] [address] [phone]  REFERRING PROVIDER:  [place],   [address] [phone]  RESPONSIBLE PARTY:   Extended Emergency Contact Information Primary Emergency Contact: Eduardo Richards Address: [place],  [address] [address] [place] Phone: [phone] Mobile Phone: [phone] Relation: Spouse Secondary Emergency Contact: Eduardo, Richards Mobile Phone: [phone] Relation: Son  I met with Eduardo Richards by telephone due to lack of technology.   ASSESSMENT AND RECOMMENDATIONS:   1. Advance Care Planning/Goals of Care: Goals include to maximize quality of life and symptom management. I discussed medical decisions and daughter is HCPOA. Has a living will. We discussed the MOST form being a medical form to communicate goals of care wishes. I also suggested an Elderlawyer to discuss financial planning needs.  2. Symptom Management:   Care-giving issues: Wife states he is resistant to family for personal hygiene. Has long term care insurance which pays 3 years at home and 3 years in a facility. We discussed having a fee for service person to come in.  She is concerned about pacing the use of the resources. I suggested private pay several days a week or looking into dementia day programs such as the [employer] Day program in [place]. They live in [place].   Therapy: Had PT from August to December  and has ST currently for cognitive intervention. She felt he should have home health again and I outlined participation conditions for home health. She feels he is entitled to 6 months of PT but there is no new qualifying diagnosis. Wife states he is in declining in his function. He has a rollator.  I suggested part B out patient PT for addressing functional decline.  Dementia: Onset was 3-5 years ago. Has mixed dementia, we discussed these manifestations. He has had some hallucinations but not frightening ones. He is able to ambulate but has also fallen not using his walker. We discussed the progression of the disease and increasing care needs upcoming.  3. Family /Caregiver/Community Supports: Lives with wife in own home. Wife states they need some assistance with ADLs. She felt they would not qualify for need based programs. We discussed options and resources/expense of in home care.I also suggested the alzheimers organization web site or support groups.  4. Cognitive / Functional decline: Dementia stage 6E-7A.Able to ambulate, do some adls with cueing.  5. Follow up Palliative Care Visit: Palliative care will continue to follow for goals of care clarification and symptom management. Return 2-3 months  or prn.   I spent 60 minutes providing this consultation, rom 1200 to 1300. More than 50% of the time in this consultation was spent coordinating communication.   HISTORY OF PRESENT ILLNESS:  Eduardo Richards is a 79 y.o. year old male with multiple medical problems including Mixed dementia, DM, CAD, HTN, h/o CVA. Palliative Care was asked to follow this patient by consultation request of Eduardo Richards, * to help address advance care  planning and goals of care. This is an initial visit.  CODE STATUS: TBD  PPS: 50% HOSPICE ELIGIBILITY/DIAGNOSIS: no, due to > 6 month prognosis.  PAST MEDICAL HISTORY:  Past Medical History:  Diagnosis Date  . Coronary artery disease   . Diabetes  mellitus without complication ([place])   . Hypertension     SOCIAL HX:  Social History   Tobacco Use  . Smoking status: Former Research scientist (life sciences)  . Smokeless tobacco: Never Used  Substance Use Topics  . Alcohol use: Yes    Comment: occasionally    ALLERGIES:  Allergies  Allergen Reactions  . Clindamycin Rash  . Glipizide Other (See Comments)    Reported confusion while taking the medication  . Tamsulosin Other (See Comments)    ED ED ED ED   . Codeine Nausea And Vomiting     PERTINENT MEDICATIONS:  Outpatient Encounter Medications as of 02/10/2019  Medication Sig  . allopurinol (ZYLOPRIM) 100 MG tablet Take by mouth.  Marland Kitchen aspirin 81 MG chewable tablet Chew by mouth.  Marland Kitchen atorvastatin (LIPITOR) 40 MG tablet Take by mouth.  Marland Kitchen buPROPion (WELLBUTRIN XL) 150 MG 24 hr tablet Take by mouth.  . diltiazem (CARDIZEM CD) 180 MG 24 hr capsule Take by mouth.  . finasteride (PROSCAR) 5 MG tablet Take 1 tablet (5 mg total) by mouth daily.  Marland Kitchen ketoconazole (NIZORAL) 2 % shampoo APPLY ON THE SKIN DAILY TO [facility] UPPER BODY AND SCALP X 2 WEEKS THEN ONCE A MONTH AS MAINTENCE  . metoprolol succinate (TOPROL-XL) 25 MG 24 hr tablet Take 25 mg by mouth daily.  . mirabegron ER (MYRBETRIQ) 50 MG TB24 tablet TAKE 1 TABLET BY MOUTH EVERY DAY  . torsemide (DEMADEX) 20 MG tablet Take 20 mg by mouth 2 (two) times daily.  . TRADJENTA 5 MG TABS tablet Take 5 mg by mouth daily.  Marland Kitchen warfarin (COUMADIN) 4 MG tablet Take by mouth.   No facility-administered encounter medications on file as of 02/10/2019.    PHYSICAL EXAM / ROS:   Current and past weights: 196 lb reported mid Dec. 2020 General: NAD, WNWD Cardiovascular: no chest pain reported, no edema reported Pulmonary: no cough, no increased SOB, room air Abdomen: appetite fair, endorses constipation, incontinent of bowel at times GU: denies dysuria, incontinent of urine at times MSK:  no joint deformities, ambulatory, falls reportedd Skin: no rashes or wounds  reported Neurological: Weakness, advanced AD, vascular manifestations., h/o CVA.  Eduardo Coop, NP, [facility]

## 2019-05-04 ENCOUNTER — Telehealth: Payer: Self-pay | Admitting: Primary Care

## 2019-05-04 NOTE — Telephone Encounter (Signed)
T/c to home number, no answer, message left. Please call to schedule community palliative visit if desired.

## 2019-05-17 ENCOUNTER — Telehealth: Payer: Self-pay | Admitting: Urology

## 2019-05-17 NOTE — Telephone Encounter (Signed)
Patient notified and wants the RUS done in [place]. Order is in.

## 2019-05-17 NOTE — Telephone Encounter (Signed)
Please let Mr. Aleshire wife know that we need to get a renal ultrasound of his kidneys at this time as the cysts on the CT in 08/2018 were indeterminate.  We need to make sure the cysts are not cancers.

## 2019-06-02 ENCOUNTER — Telehealth: Payer: Self-pay

## 2019-06-02 NOTE — Telephone Encounter (Signed)
Volunteer support call made for palliative care, message left

## 2019-06-04 ENCOUNTER — Encounter (INDEPENDENT_AMBULATORY_CARE_PROVIDER_SITE_OTHER): Payer: Self-pay

## 2019-06-04 ENCOUNTER — Other Ambulatory Visit: Payer: Self-pay

## 2019-06-04 ENCOUNTER — Ambulatory Visit
Admission: RE | Admit: 2019-06-04 | Discharge: 2019-06-04 | Disposition: A | Discharge status: Home / Self Care | Payer: Medicare Other | Source: Ambulatory Visit | Attending: Urology | Admitting: Urology

## 2019-06-04 DIAGNOSIS — N281 Cyst of kidney, acquired: Secondary | ICD-10-CM | POA: Diagnosis present

## 2019-06-15 ENCOUNTER — Telehealth: Payer: Self-pay | Admitting: Family Medicine

## 2019-06-15 NOTE — Telephone Encounter (Signed)
Patient notified and voiced understanding.

## 2019-06-15 NOTE — Telephone Encounter (Signed)
-----   Message from Nori Riis, PA-C sent at 06/15/2019 12:12 PM EDT ----- Please let Mr. Cristobal know that his renal ultrasound showed that he had benign renal cysts.  No follow up necessary.  We will see him September.

## 2019-10-05 ENCOUNTER — Ambulatory Visit
Admission: EM | Admit: 2019-10-05 | Discharge: 2019-10-05 | Disposition: A | Discharge status: Home / Self Care | Payer: Medicare Other

## 2019-10-05 ENCOUNTER — Inpatient Hospital Stay
Admission: EM | Admit: 2019-10-05 | Discharge: 2019-10-12 | DRG: 871 | Disposition: A | Discharge status: Skilled Nursing Facility | Payer: Medicare Other | Source: Ambulatory Visit | Attending: Internal Medicine | Admitting: Internal Medicine

## 2019-10-05 ENCOUNTER — Emergency Department: Payer: Medicare Other

## 2019-10-05 ENCOUNTER — Encounter: Payer: Self-pay | Admitting: Emergency Medicine

## 2019-10-05 ENCOUNTER — Other Ambulatory Visit: Payer: Self-pay

## 2019-10-05 DIAGNOSIS — Z66 Do not resuscitate: Secondary | ICD-10-CM | POA: Diagnosis present

## 2019-10-05 DIAGNOSIS — D696 Thrombocytopenia, unspecified: Secondary | ICD-10-CM | POA: Diagnosis present

## 2019-10-05 DIAGNOSIS — E119 Type 2 diabetes mellitus without complications: Secondary | ICD-10-CM

## 2019-10-05 DIAGNOSIS — A419 Sepsis, unspecified organism: Secondary | ICD-10-CM | POA: Diagnosis not present

## 2019-10-05 DIAGNOSIS — F0280 Dementia in other diseases classified elsewhere without behavioral disturbance: Secondary | ICD-10-CM | POA: Diagnosis present

## 2019-10-05 DIAGNOSIS — G9341 Metabolic encephalopathy: Secondary | ICD-10-CM | POA: Diagnosis present

## 2019-10-05 DIAGNOSIS — N184 Chronic kidney disease, stage 4 (severe): Secondary | ICD-10-CM | POA: Diagnosis present

## 2019-10-05 DIAGNOSIS — Z8673 Personal history of transient ischemic attack (TIA), and cerebral infarction without residual deficits: Secondary | ICD-10-CM | POA: Diagnosis not present

## 2019-10-05 DIAGNOSIS — F0150 Vascular dementia without behavioral disturbance: Secondary | ICD-10-CM | POA: Diagnosis present

## 2019-10-05 DIAGNOSIS — F0390 Unspecified dementia without behavioral disturbance: Secondary | ICD-10-CM | POA: Diagnosis present

## 2019-10-05 DIAGNOSIS — Z87891 Personal history of nicotine dependence: Secondary | ICD-10-CM

## 2019-10-05 DIAGNOSIS — N400 Enlarged prostate without lower urinary tract symptoms: Secondary | ICD-10-CM | POA: Diagnosis present

## 2019-10-05 DIAGNOSIS — J189 Pneumonia, unspecified organism: Secondary | ICD-10-CM

## 2019-10-05 DIAGNOSIS — E1169 Type 2 diabetes mellitus with other specified complication: Secondary | ICD-10-CM | POA: Diagnosis present

## 2019-10-05 DIAGNOSIS — I4891 Unspecified atrial fibrillation: Secondary | ICD-10-CM | POA: Diagnosis present

## 2019-10-05 DIAGNOSIS — E876 Hypokalemia: Secondary | ICD-10-CM | POA: Diagnosis present

## 2019-10-05 DIAGNOSIS — N12 Tubulo-interstitial nephritis, not specified as acute or chronic: Secondary | ICD-10-CM

## 2019-10-05 DIAGNOSIS — E860 Dehydration: Secondary | ICD-10-CM | POA: Diagnosis present

## 2019-10-05 DIAGNOSIS — E1122 Type 2 diabetes mellitus with diabetic chronic kidney disease: Secondary | ICD-10-CM | POA: Diagnosis present

## 2019-10-05 DIAGNOSIS — I4819 Other persistent atrial fibrillation: Secondary | ICD-10-CM | POA: Diagnosis not present

## 2019-10-05 DIAGNOSIS — K5900 Constipation, unspecified: Secondary | ICD-10-CM | POA: Diagnosis present

## 2019-10-05 DIAGNOSIS — R32 Unspecified urinary incontinence: Secondary | ICD-10-CM | POA: Diagnosis present

## 2019-10-05 DIAGNOSIS — Z20822 Contact with and (suspected) exposure to covid-19: Secondary | ICD-10-CM | POA: Diagnosis present

## 2019-10-05 DIAGNOSIS — B9620 Unspecified Escherichia coli [E. coli] as the cause of diseases classified elsewhere: Secondary | ICD-10-CM | POA: Diagnosis not present

## 2019-10-05 DIAGNOSIS — N390 Urinary tract infection, site not specified: Secondary | ICD-10-CM | POA: Diagnosis present

## 2019-10-05 DIAGNOSIS — G309 Alzheimer's disease, unspecified: Secondary | ICD-10-CM | POA: Diagnosis present

## 2019-10-05 DIAGNOSIS — E1159 Type 2 diabetes mellitus with other circulatory complications: Secondary | ICD-10-CM | POA: Diagnosis not present

## 2019-10-05 DIAGNOSIS — A4151 Sepsis due to Escherichia coli [E. coli]: Secondary | ICD-10-CM | POA: Diagnosis present

## 2019-10-05 DIAGNOSIS — Z7901 Long term (current) use of anticoagulants: Secondary | ICD-10-CM | POA: Diagnosis not present

## 2019-10-05 DIAGNOSIS — Z79899 Other long term (current) drug therapy: Secondary | ICD-10-CM

## 2019-10-05 DIAGNOSIS — I10 Essential (primary) hypertension: Secondary | ICD-10-CM | POA: Diagnosis not present

## 2019-10-05 DIAGNOSIS — I129 Hypertensive chronic kidney disease with stage 1 through stage 4 chronic kidney disease, or unspecified chronic kidney disease: Secondary | ICD-10-CM | POA: Diagnosis present

## 2019-10-05 DIAGNOSIS — Z7982 Long term (current) use of aspirin: Secondary | ICD-10-CM

## 2019-10-05 DIAGNOSIS — I2510 Atherosclerotic heart disease of native coronary artery without angina pectoris: Secondary | ICD-10-CM | POA: Diagnosis present

## 2019-10-05 DIAGNOSIS — I4821 Permanent atrial fibrillation: Secondary | ICD-10-CM | POA: Diagnosis present

## 2019-10-05 DIAGNOSIS — E785 Hyperlipidemia, unspecified: Secondary | ICD-10-CM | POA: Diagnosis present

## 2019-10-05 HISTORY — DX: Cerebral infarction, unspecified: I63.9

## 2019-10-05 HISTORY — DX: Unspecified dementia, unspecified severity, without behavioral disturbance, psychotic disturbance, mood disturbance, and anxiety: F03.90

## 2019-10-05 LAB — CBC WITH DIFFERENTIAL/PLATELET
Abs Immature Granulocytes: 0.11 10*3/uL — ABNORMAL HIGH (ref 0.00–0.07)
Basophils Absolute: 0 10*3/uL (ref 0.0–0.1)
Basophils Relative: 0 %
Eosinophils Absolute: 0 10*3/uL (ref 0.0–0.5)
Eosinophils Relative: 0 %
HCT: 41 % (ref 39.0–52.0)
Hemoglobin: 13.9 g/dL (ref 13.0–17.0)
Immature Granulocytes: 1 %
Lymphocytes Relative: 4 %
Lymphs Abs: 0.4 10*3/uL — ABNORMAL LOW (ref 0.7–4.0)
MCH: 32.1 pg (ref 26.0–34.0)
MCHC: 33.9 g/dL (ref 30.0–36.0)
MCV: 94.7 fL (ref 80.0–100.0)
Monocytes Absolute: 1 10*3/uL (ref 0.1–1.0)
Monocytes Relative: 9 %
Neutro Abs: 9.7 10*3/uL — ABNORMAL HIGH (ref 1.7–7.7)
Neutrophils Relative %: 86 %
Platelets: 123 10*3/uL — ABNORMAL LOW (ref 150–400)
RBC: 4.33 MIL/uL (ref 4.22–5.81)
RDW: 14.1 % (ref 11.5–15.5)
WBC: 11.2 10*3/uL — ABNORMAL HIGH (ref 4.0–10.5)
nRBC: 0 % (ref 0.0–0.2)

## 2019-10-05 LAB — COMPREHENSIVE METABOLIC PANEL
ALT: 30 U/L (ref 0–44)
AST: 32 U/L (ref 15–41)
Albumin: 2.5 g/dL — ABNORMAL LOW (ref 3.5–5.0)
Alkaline Phosphatase: 109 U/L (ref 38–126)
Anion gap: 13 (ref 5–15)
BUN: 53 mg/dL — ABNORMAL HIGH (ref 8–23)
CO2: 22 mmol/L (ref 22–32)
Calcium: 9.1 mg/dL (ref 8.9–10.3)
Chloride: 101 mmol/L (ref 98–111)
Creatinine, Ser: 2.87 mg/dL — ABNORMAL HIGH (ref 0.61–1.24)
GFR calc Af Amer: 23 mL/min — ABNORMAL LOW (ref >60)
GFR calc non Af Amer: 20 mL/min — ABNORMAL LOW (ref >60)
Glucose, Bld: 161 mg/dL — ABNORMAL HIGH (ref 70–99)
Potassium: 3 mmol/L — ABNORMAL LOW (ref 3.5–5.1)
Sodium: 136 mmol/L (ref 135–145)
Total Bilirubin: 1.5 mg/dL — ABNORMAL HIGH (ref 0.3–1.2)
Total Protein: 6 g/dL — ABNORMAL LOW (ref 6.5–8.1)

## 2019-10-05 LAB — URINALYSIS, COMPLETE (UACMP) WITH MICROSCOPIC
Bilirubin Urine: NEGATIVE
Glucose, UA: NEGATIVE mg/dL
Ketones, ur: NEGATIVE mg/dL
Nitrite: NEGATIVE
Protein, ur: >=300 mg/dL — AB
Specific Gravity, Urine: 1.012 (ref 1.005–1.030)
WBC, UA: >50 WBC/hpf — ABNORMAL HIGH (ref 0–5)
pH: 5 (ref 5.0–8.0)

## 2019-10-05 LAB — PROTIME-INR
INR: 1.4 — ABNORMAL HIGH (ref 0.8–1.2)
Prothrombin Time: 16.5 seconds — ABNORMAL HIGH (ref 11.4–15.2)

## 2019-10-05 LAB — APTT: aPTT: 35 seconds (ref 24–36)

## 2019-10-05 LAB — SARS CORONAVIRUS 2 BY RT PCR (HOSPITAL ORDER, PERFORMED IN ~~LOC~~ HOSPITAL LAB): SARS Coronavirus 2: NEGATIVE

## 2019-10-05 LAB — LACTIC ACID, PLASMA
Lactic Acid, Venous: 1 mmol/L (ref 0.5–1.9)
Lactic Acid, Venous: 1.1 mmol/L (ref 0.5–1.9)

## 2019-10-05 MED ORDER — ACETAMINOPHEN 500 MG PO TABS
1000.0000 mg | ORAL_TABLET | Freq: Once | ORAL | Status: AC
  Administered 2019-10-05: 1000 mg via ORAL
  Filled 2019-10-05: qty 2

## 2019-10-05 MED ORDER — SODIUM CHLORIDE 0.9 % IV BOLUS
500.0000 mL | Freq: Once | INTRAVENOUS | Status: AC
  Administered 2019-10-05: 500 mL via INTRAVENOUS

## 2019-10-05 MED ORDER — SODIUM CHLORIDE 0.9 % IV SOLN
1.0000 g | Freq: Once | INTRAVENOUS | Status: AC
  Administered 2019-10-05: 1 g via INTRAVENOUS
  Filled 2019-10-05: qty 10

## 2019-10-05 NOTE — H&P (Signed)
History and Physical    Eduardo Richards JSH:[id number] DOB: 1941-01-12 DOA: 10/05/2019  PCP: [name], MD  Patient coming from: [place] urgent [facility]  I have personally briefly reviewed patient's old medical records in [place]  Chief Complaint: Fevers, confusion, A. fib with RVR  HPI: Eduardo Richards is a 79 y.o. male with medical history significant for persistent atrial fibrillation on Eliquis, CKD stage IV, CAD, history of CVA, T2DM, HTN, HLD, BPH, and mixed Alzheimer's and vascular dementia who presents to the ED for evaluation of confusion, fevers, and A. fib with RVR.  Patient is unable to provide history due to excessive somnolence therefore majority history is obtained from EDP, chart review, and patient's wife at bedside.  Wife states that patient has dementia but has his baseline he is alert and oriented to self and family members but is not generally oriented to time or location.  He normally ambulates with assistance with the use of a cane.  2 days ago he began to have loose stools and increasing confusion from baseline.  He started Imodium with resolution of his loose stools.  Today he had significantly worsening confusion with new fevers and foul-smelling urine.  He has a physical therapist who comes to the house and they noted his heart rate was very tachycardic.  He was taken to the [facility] urgent care for further evaluation.  EKG was obtained and showed A. fib with RVR, rate 145.  EMS were called and patient was brought to the ED for further evaluation.  Per ED triage notes patient was noted to have fever of 101 Fahrenheit, O2 saturation 94% on room air, and A. fib with heart rate in the 140s by EMS.  He was given 500 cc of fluid on route to the ED.  ED Course:  Initial vitals showed BP 154/98, pulse 130, RR 26, temp 100.5 Fahrenheit, SPO2 94% on room air.  Per ED documentation temp was up to 101 Fahrenheit with EMS.  Labs show WBC 11.2, hemoglobin  13.9, platelets 123,000, sodium 136, potassium 3.0, bicarb 22, BUN 53, creatinine 2.87, EGFR 20 (previous creatinine 3.10 with EGFR 18 on 07/08/2019), serum glucose 161, AST 32, ALT 30, alk phos 109, total bilirubin 1.5, lactic acid 1.1 > 1.0.  Urinalysis showed >= 300 protein, negative nitrites, large leukocytes, 0-5 RBC/hpf, >50 WBC/hpf, many bacteria on microscopy.  Urine culture and blood cultures were obtained and pending.  SARS-CoV-2 PCR is negative.  Portable chest x-ray showed enlarged cardiac silhouette without focal consolidation, effusion, or edema.  CT head without contrast showed chronic atrophic and ischemic changes without acute infarct noted.  Changes suggestive of acute sinusitis involving the left maxillary and sphenoid sinuses also noted.  Patient was given 500 cc normal saline and IV ceftriaxone.  The hospitalist service was consulted to admit for further evaluation and management.  Review of Systems:  Unable to obtain full review of systems due to patient's acute delirium and excessive somnolence.   Past Medical History:  Diagnosis Date   Coronary artery disease    Dementia ([place])    Diabetes mellitus without complication ([place])    Hypertension    Stroke [facility])     Past Surgical History:  Procedure Laterality Date   BACK SURGERY     CARPAL TUNNEL RELEASE Bilateral     Social History:  reports that he has quit smoking. He has never used smokeless tobacco. He reports current alcohol use. He reports that he does not use  drugs.  Allergies  Allergen Reactions   Clindamycin Rash   Glipizide Other (See Comments)    Reported confusion while taking the medication   Tamsulosin Other (See Comments)    ED   Codeine Nausea And Vomiting    Family History  Problem Relation Age of Onset   Cancer Mother    Cancer Father      Prior to Admission medications   Medication Sig Start Date End Date Taking? Authorizing Provider  aspirin 81 MG EC tablet Take  81 mg by mouth daily.    Yes [provider]  allopurinol (ZYLOPRIM) 100 MG tablet Take by mouth. 03/13/12 06/16/19  [provider]  apixaban (ELIQUIS) 2.5 MG TABS tablet Take 2.5 mg by mouth 2 (two) times daily.  12/21/18   [provider]  atorvastatin (LIPITOR) 40 MG tablet Take by mouth. 04/28/12   [provider]  buPROPion (WELLBUTRIN XL) 150 MG 24 hr tablet Take by mouth. 07/28/17 08/13/19  [provider]  busPIRone (BUSPAR) 5 MG tablet Take 1 tablet by mouth daily. 01/04/19   [provider]  finasteride (PROSCAR) 5 MG tablet Take 1 tablet (5 mg total) by mouth daily. 10/05/18   MacDiarmid, Nicki Reaper, MD  ketoconazole (NIZORAL) 2 % shampoo APPLY ON THE SKIN DAILY TO [facility] UPPER BODY AND SCALP X 2 WEEKS THEN ONCE A MONTH AS MAINTENCE 06/25/11   [provider]  memantine (NAMENDA) 5 MG tablet Take by mouth. 08/16/18   [provider]  metoprolol succinate (TOPROL-XL) 25 MG 24 hr tablet Take 25 mg by mouth daily.    [provider]  mirabegron ER (MYRBETRIQ) 50 MG TB24 tablet TAKE 1 TABLET BY MOUTH EVERY DAY 06/15/18   [provider]  RYBELSUS 3 MG TABS Take 1 tablet by mouth daily. 09/07/19   [provider]  torsemide (DEMADEX) 20 MG tablet Take 20 mg by mouth 2 (two) times daily.    [provider]  TRADJENTA 5 MG TABS tablet Take 5 mg by mouth daily. 07/14/18   [provider]  warfarin (COUMADIN) 4 MG tablet Take by mouth.    [provider]    Physical Exam: Vitals:   10/05/19 2116 10/05/19 2130 10/05/19 2200 10/05/19 2321  BP:  (!) 151/95 (!) 152/85 134/81  Pulse:  (!) 111 (!) 115 (!) 110  Resp:  13 (!) 25 19  Temp:      TempSrc:      SpO2:  95% 96% 92%  Weight: 81 kg     Height: 5' 9"  (1.753 m)      Constitutional: Elderly chronically ill-appearing man resting supine in bed, very somnolent but appears in no acute distress.  He will momentarily awaken to light  physical stimulation before quickly closing-return to sleep. Eyes: PERRL, lids and conjunctivae normal ENMT: Mucous membranes are dry. Posterior pharynx clear of any exudate or lesions. Neck: normal, supple, no masses. Respiratory: clear to auscultation anteriorly.  Normal respiratory effort. No accessory muscle use.  Cardiovascular: Irregularly irregular with mild tachycardia, no murmurs / rubs / gallops. No extremity edema. 2+ pedal pulses. Abdomen: no tenderness, no masses palpated. Bowel sounds positive.  Musculoskeletal: no clubbing / cyanosis. No joint deformity upper and lower extremities. Good ROM, no contractures. Normal muscle tone.  Skin: no rashes, lesions, ulcers. No induration Neurologic: Limited due to excessive somnolence.  Sensation appears intact, moves extremities spontaneously. Psychiatric: Excessively somnolent.  Labs on Admission: I have personally reviewed following labs and imaging  studies  CBC: Recent Labs  Lab 10/05/19 2115  WBC 11.2*  NEUTROABS 9.7*  HGB 13.9  HCT 41.0  MCV 94.7  PLT 371*   Basic Metabolic Panel: Recent Labs  Lab 10/05/19 2115  NA 136  K 3.0*  CL 101  CO2 22  GLUCOSE 161*  BUN 53*  CREATININE 2.87*  CALCIUM 9.1   GFR: Estimated Creatinine Clearance: 20.9 mL/min (A) (by C-G formula based on SCr of 2.87 mg/dL (H)). Liver Function Tests: Recent Labs  Lab 10/05/19 2115  AST 32  ALT 30  ALKPHOS 109  BILITOT 1.5*  PROT 6.0*  ALBUMIN 2.5*   No results for input(s): LIPASE, AMYLASE in the last 168 hours. No results for input(s): AMMONIA in the last 168 hours. Coagulation Profile: Recent Labs  Lab 10/05/19 2115  INR 1.4*   Cardiac Enzymes: No results for input(s): CKTOTAL, CKMB, CKMBINDEX, TROPONINI in the last 168 hours. BNP (last 3 results) No results for input(s): PROBNP in the last 8760 hours. HbA1C: No results for input(s): HGBA1C in the last 72 hours. CBG: No results for input(s): GLUCAP in the last 168  hours. Lipid Profile: No results for input(s): CHOL, HDL, LDLCALC, TRIG, CHOLHDL, LDLDIRECT in the last 72 hours. Thyroid Function Tests: No results for input(s): TSH, T4TOTAL, FREET4, T3FREE, THYROIDAB in the last 72 hours. Anemia Panel: No results for input(s): VITAMINB12, FOLATE, FERRITIN, TIBC, IRON, RETICCTPCT in the last 72 hours. Urine analysis:    Component Value Date/Time   COLORURINE YELLOW (A) 10/05/2019 2115   APPEARANCEUR CLOUDY (A) 10/05/2019 2115   APPEARANCEUR Cloudy (A) 08/17/2018 1320   LABSPEC 1.012 10/05/2019 2115   PHURINE 5.0 10/05/2019 2115   GLUCOSEU NEGATIVE 10/05/2019 2115   HGBUR SMALL (A) 10/05/2019 2115   BILIRUBINUR NEGATIVE 10/05/2019 2115   BILIRUBINUR Negative 08/17/2018 1320   KETONESUR NEGATIVE 10/05/2019 2115   PROTEINUR >=300 (A) 10/05/2019 2115   NITRITE NEGATIVE 10/05/2019 2115   LEUKOCYTESUR LARGE (A) 10/05/2019 2115    Radiological Exams on Admission: CT Head Wo Contrast  Result Date: 10/05/2019 CLINICAL DATA:  Increasing confusion and possible sepsis EXAM: CT HEAD WITHOUT CONTRAST TECHNIQUE: Contiguous axial images were obtained from the base of the skull through the vertex without intravenous contrast. COMPARISON:  12/14/2018 FINDINGS: Brain: Chronic atrophic and ischemic changes are noted. These are stable in appearance from the prior exam. Old lacunar infarcts are noted primarily on the right stable from the prior study. Prior infarct in the right posterior parietal region is noted as well. Vascular: No hyperdense vessel or unexpected calcification. Skull: Normal. Negative for fracture or focal lesion. Sinuses/Orbits: Near complete opacification of the left maxillary antrum is noted consistent with acute sinus disease. Similar changes are noted sphenoid sinus on the left. Other: None IMPRESSION: Chronic atrophic and ischemic changes.  No acute infarct is noted. Changes consistent with acute sinusitis involving the left maxillary and sphenoid  sinuses. Electronically Signed   By: Inez Catalina M.D.   On: 10/05/2019 22:36   DG Chest Port 1 View  Result Date: 10/05/2019 CLINICAL DATA:  Fever, sepsis EXAM: PORTABLE CHEST 1 VIEW COMPARISON:  09/19/2017 FINDINGS: Single frontal view of the chest demonstrates an enlarged cardiac silhouette, likely exaggerated by portable technique and AP positioning. No airspace disease, effusion, or pneumothorax. IMPRESSION: 1. Enlarged cardiac silhouette. 2. No acute airspace disease. Electronically Signed   By: Randa Ngo M.D.   On: 10/05/2019 21:42    EKG: Independently reviewed.  EKG from urgent care showed A.  fib with RVR, rate 145.  Assessment/Plan Principal Problem:   Sepsis due to urinary tract infection (HCC) Active Problems:   Coronary artery disease   Diabetes mellitus without complication (HCC)   Dementia ([place])   Hypertension associated with diabetes ([place])   Hyperlipidemia associated with type 2 diabetes mellitus ([place])   CKD (chronic kidney disease), stage IV ([place])   Hypokalemia   Persistent atrial fibrillation ([place])  Yuvaan Zachary Nole is a 79 y.o. male with medical history significant for atrial fibrillation on Eliquis, CKD stage IV, CAD, history of CVA, T2DM, HTN, HLD, BPH, and mixed Alzheimer's and vascular dementia who is admitted with sepsis due to UTI.  Sepsis due to UTI: Patient presented with temp 101 Fahrenheit, pulse 130, tachypnea with RR 26, and worsened altered mental status from baseline.  Urinalysis is consistent with UTI. -Continue IV ceftriaxone -Follow urine and blood cultures -Start IV fluid hydration with LR @ 100 mL/hr overnight  Persistent atrial fibrillation: In A. fib with RVR on arrival, likely triggered by sepsis due to UTI, dehydration, and hypokalemia.  Rate improving with initial IV fluids.  CHA2DS2-VASc score is 5.  Patient was on Eliquis anticoagulation. -Monitor on telemetry -Resume home diltiazem and Toprol-XL for rate control -Gentle IV fluid  hydration as above  Hypokalemia: K3.0 on admission.  Will give IV K 10 mEq x 2 rounds and add potassium to maintenance fluids.  Check magnesium and replete as needed.  CKD stage IV: Stable relative to most recent labs.  Continue monitor.  Avoid NSAIDs.  Type 2 diabetes: Last A1c 6.8% on 07/29/2019.  Hold home Rybelsus and placed on sensitive SSI while in hospital.  Adjust as needed.  Hypertension: Stable.  Resume home diltiazem and Toprol-XL.  CAD: Continue aspirin 81 mg daily and atorvastatin.  Hyperlipidemia:  Continue atorvastatin 40 mg daily.  BPH: Continue Proscar.  Mixed Alzheimer's and vascular dementia with acute delirium: Acute delirium secondary to infection.  Treat sepsis as above.  Continue home Namenda.  Delirium precautions.  DVT prophylaxis: Eliquis Code Status: DNR, discussed and confirmed with patient's wife at bedside Family Communication: Discussed with patient's wife at bedside Disposition Plan: From home, dispo pending improvement in sepsis physiology, mental status towards baseline, and ability to transition to oral antibiotics. Consults called: None Admission status:  Status is: Inpatient  Remains inpatient appropriate because:Altered mental status, IV treatments appropriate due to intensity of illness or inability to take PO and Inpatient level of care appropriate due to severity of illness   Dispo: The patient is from: Home              Anticipated d/c is to: Home              Anticipated d/c date is: 3 days              Patient currently is not medically stable to d/c.   Zada Finders MD [facility]  If 7PM-7AM, please contact night-coverage [email or web address]  10/05/2019, 11:40 PM

## 2019-10-05 NOTE — ED Notes (Signed)
Patient is being discharged from the Urgent Care and sent to the Emergency Department via EMS . Per Margarette Canada, NP, patient is in need of higher level of care due to a-fib with RVR. Patient is aware and verbalizes understanding of plan of care.  Vitals:   10/05/19 1940  BP: (!) 134/95  Pulse: (!) 130  Resp: 18  Temp: (!) 100.7 F (38.2 C)  SpO2: 94%

## 2019-10-05 NOTE — Discharge Instructions (Addendum)
Go to [facility] for further evaluation and management of your atrial fibrillation with RVR.

## 2019-10-05 NOTE — ED Provider Notes (Signed)
[facility] Emergency Department Provider Note   ____________________________________________   First MD Initiated Contact with Patient 10/05/19 2106     (approximate)  I have reviewed the triage vital signs and the nursing notes.   HISTORY  Chief Complaint Altered Mental Status  EM caveat, altered mental status, confusion  HPI Eduardo Richards is a 79 y.o. male here for evaluation for confusion and fever   Per EMS, also urgent care note patient having fever confusion and foul-smelling odor.  He was seen at urgent care noted to be tachycardic and febrile referred to the ER for further evaluation.  EMS reports concern for possible sepsis, and also possible urinary tract infection as it potential source given the history that they were given at urgent care  Patient denies being in any pain.  No headache.  No chest pain.  No trouble breathing.     Past Medical History:  Diagnosis Date  . Coronary artery disease   . Dementia ([place])   . Diabetes mellitus without complication ([place])   . Hypertension   . Stroke [facility])     There are no problems to display for this patient.   Past Surgical History:  Procedure Laterality Date  . BACK SURGERY    . CARPAL TUNNEL RELEASE Bilateral     Prior to Admission medications   Medication Sig Start Date End Date Taking? Authorizing Provider  allopurinol (ZYLOPRIM) 100 MG tablet Take by mouth. 03/13/12 06/16/19  [provider]  apixaban (ELIQUIS) 2.5 MG TABS tablet TAKE 1 TABLET (2.5 MG TOTAL) BY MOUTH EVERY 12 (TWELVE) HOURS 12/21/18   [provider]  aspirin 81 MG chewable tablet Chew by mouth.    [provider]  atorvastatin (LIPITOR) 40 MG tablet Take by mouth. 04/28/12   [provider]  buPROPion (WELLBUTRIN XL) 150 MG 24 hr tablet Take by mouth. 07/28/17 08/13/19  [provider]  busPIRone (BUSPAR) 5 MG tablet Take 1 tablet by mouth daily. 01/04/19   [provider]  diltiazem (CARDIZEM CD) 180 MG 24 hr capsule Take by mouth. 03/03/12 06/15/19  [provider]  diltiazem (TIAZAC) 180 MG 24 hr capsule Take by mouth. 03/03/12 05/18/20  [provider]  finasteride (PROSCAR) 5 MG tablet Take 1 tablet (5 mg total) by mouth daily. 10/05/18   MacDiarmid, Nicki Reaper, MD  ketoconazole (NIZORAL) 2 % shampoo APPLY ON THE SKIN DAILY TO [facility] UPPER BODY AND SCALP X 2 WEEKS THEN ONCE A MONTH AS MAINTENCE 06/25/11   [provider]  memantine (NAMENDA) 5 MG tablet Take by mouth. 08/16/18   [provider]  metoprolol succinate (TOPROL-XL) 25 MG 24 hr tablet Take 25 mg by mouth daily.    [provider]  mirabegron ER (MYRBETRIQ) 50 MG TB24 tablet TAKE 1 TABLET BY MOUTH EVERY DAY 06/15/18   [provider]  RYBELSUS 3 MG TABS Take 1 tablet by mouth daily. 09/07/19   [provider]  torsemide (DEMADEX) 20 MG tablet Take 20 mg by mouth 2 (two) times daily.    [provider]  TRADJENTA 5 MG TABS tablet Take 5 mg by mouth daily. 07/14/18   [provider]  warfarin (COUMADIN) 4 MG tablet Take by mouth.    [provider]    Allergies Clindamycin, Glipizide, Tamsulosin, and Codeine  Family History  Problem Relation Age of Onset  . Cancer Mother   . Cancer Father     Social History Social History  Tobacco Use  . Smoking status: Former Research scientist (life sciences)  . Smokeless tobacco: Never Used  Vaping Use  . Vaping Use: Never used  Substance Use Topics  . Alcohol use: Yes    Comment: occasionally  . Drug use: Never    Review of Systems  EM caveat, history is provided by EMS and also reviewed urgent care note   ____________________________________________   PHYSICAL EXAM:  VITAL SIGNS: ED Triage Vitals  Enc Vitals Group     BP 10/05/19 2114 (!) 154/98     Pulse Rate 10/05/19 2114 (!) 128     Resp 10/05/19 2114 (!) 26     Temp 10/05/19 2114 (!) 100.5 F (38.1 C)     Temp Source  10/05/19 2114 Oral     SpO2 10/05/19 2114 94 %     Weight 10/05/19 2116 178 lb 9.2 oz (81 kg)     Height 10/05/19 2116 5\' 9"  (1.753 m)     Head Circumference --      Peak Flow --      Pain Score --      Pain Loc --      Pain Edu? --      Excl. in [place]? --     Constitutional: Alert and oriented to self but not to place or situation.  Mildly ill-appearing, in no acute distress.  Does however seem confused unable to orient self to year place or date Eyes: Conjunctivae are normal. Head: Atraumatic. Nose: No congestion/rhinnorhea. Mouth/Throat: Mucous membranes are dry. Neck: No stridor.  Cardiovascular: Mildly tachycardic, somewhat irregular grossly normal heart sounds.  Good peripheral circulation. Respiratory: Normal respiratory effort.  No retractions. Lungs CTAB. Gastrointestinal: Soft and nontender. No distention. Musculoskeletal: No lower extremity tenderness nor edema.  No noted skin rash or lesions. Neurologic:  Normal speech and language though somewhat soft-spoken, seems pleasantly confused. No gross focal neurologic deficits are appreciated.  Skin:  Skin is warm, dry and intact. No rash noted. Psychiatric: Mood and affect are normal. Speech and behavior are normal.  ____________________________________________   LABS (all labs ordered are listed, but only abnormal results are displayed)  Labs Reviewed  COMPREHENSIVE METABOLIC PANEL - Abnormal; Notable for the following components:      Result Value   Potassium 3.0 (*)    Glucose, Bld 161 (*)    BUN 53 (*)    Creatinine, Ser 2.87 (*)    Total Protein 6.0 (*)    Albumin 2.5 (*)    Total Bilirubin 1.5 (*)    GFR calc non Af Amer 20 (*)    GFR calc Af Amer 23 (*)    All other components within normal limits  CBC WITH DIFFERENTIAL/PLATELET - Abnormal; Notable for the following components:   WBC 11.2 (*)    Platelets 123 (*)    Neutro Abs 9.7 (*)    Lymphs Abs 0.4 (*)    Abs Immature Granulocytes 0.11 (*)    All other  components within normal limits  PROTIME-INR - Abnormal; Notable for the following components:   Prothrombin Time 16.5 (*)    INR 1.4 (*)    All other components within normal limits  URINALYSIS, COMPLETE (UACMP) WITH MICROSCOPIC - Abnormal; Notable for the following components:   Color, Urine YELLOW (*)    APPearance CLOUDY (*)    Hgb urine dipstick SMALL (*)    Protein, ur >=300 (*)    Leukocytes,Ua LARGE (*)    WBC, UA >50 (*)    Bacteria,  UA MANY (*)    All other components within normal limits  CULTURE, BLOOD (ROUTINE X 2)  CULTURE, BLOOD (ROUTINE X 2)  URINE CULTURE  SARS CORONAVIRUS 2 BY RT PCR (HOSPITAL ORDER, [place] [facility])  LACTIC ACID, PLASMA  APTT  LACTIC ACID, PLASMA   ____________________________________________  EKG  Reviewed entered by me at 2110 Heart rate 120 QRS 80 QTc 420 Atrial fibrillation, rate ventricular response.  Nonspecific T wave abnormality ____________________________________________  RADIOLOGY  CT Head Wo Contrast  Result Date: 10/05/2019 CLINICAL DATA:  Increasing confusion and possible sepsis EXAM: CT HEAD WITHOUT CONTRAST TECHNIQUE: Contiguous axial images were obtained from the base of the skull through the vertex without intravenous contrast. COMPARISON:  12/14/2018 FINDINGS: Brain: Chronic atrophic and ischemic changes are noted. These are stable in appearance from the prior exam. Old lacunar infarcts are noted primarily on the right stable from the prior study. Prior infarct in the right posterior parietal region is noted as well. Vascular: No hyperdense vessel or unexpected calcification. Skull: Normal. Negative for fracture or focal lesion. Sinuses/Orbits: Near complete opacification of the left maxillary antrum is noted consistent with acute sinus disease. Similar changes are noted sphenoid sinus on the left. Other: None IMPRESSION: Chronic atrophic and ischemic changes.  No acute infarct is noted. Changes  consistent with acute sinusitis involving the left maxillary and sphenoid sinuses. Electronically Signed   By: Inez Catalina M.D.   On: 10/05/2019 22:36   DG Chest Port 1 View  Result Date: 10/05/2019 CLINICAL DATA:  Fever, sepsis EXAM: PORTABLE CHEST 1 VIEW COMPARISON:  09/19/2017 FINDINGS: Single frontal view of the chest demonstrates an enlarged cardiac silhouette, likely exaggerated by portable technique and AP positioning. No airspace disease, effusion, or pneumothorax. IMPRESSION: 1. Enlarged cardiac silhouette. 2. No acute airspace disease. Electronically Signed   By: Randa Ngo M.D.   On: 10/05/2019 21:42   CT head reviewed, no acute intracranial pathology, possible sinusitis  Chest x-ray negative for acute disease, enlarged cardiac silhouette is noted  ____________________________________________   PROCEDURES  Procedure(s) performed: None  Procedures  Critical Care performed: Yes, see critical care note(s)  CRITICAL CARE Performed by: Delman Kitten   Total critical care time: 30 minutes  Critical care time was exclusive of separately billable procedures and treating other patients.  Critical care was necessary to treat or prevent imminent or life-threatening deterioration.  Critical care was time spent personally by me on the following activities: development of treatment plan with patient and/or surrogate as well as nursing, discussions with consultants, evaluation of patient's response to treatment, examination of patient, obtaining history from patient or surrogate, ordering and performing treatments and interventions, ordering and review of laboratory studies, ordering and review of radiographic studies, pulse oximetry and re-evaluation of patient's condition.  ____________________________________________   INITIAL IMPRESSION / ASSESSMENT AND PLAN / ED COURSE  Pertinent labs & imaging results that were available during my care of the patient were reviewed by me and  considered in my medical decision making (see chart for details).   Patient presents altered mental status, confusion, noted to be febrile.  Concern for potential urinary source based on clinical history, but further testing warranted.  Exact etiology of fever is unknown.  Initiate on sepsis pathway, though also alter considerations are made for etiologies of confusion as patient also anticoagulated.  A. fib RVR, suspect this is likely secondary to his febrile status and metabolic demand  ----------------------------------------- 10:16 PM on 10/05/2019 -----------------------------------------  Patient  condition improving, heart rate improving with fluids.  Treat with antipyretics as well, ordered CT scan as patient is on anticoagulation wish to exclude etiology such as hemorrhage as cause of confusion I suspect this is nonfocal and likely driven by an infection  Thus far labs returning do show elevated white count, mild hypokalemia.  Elevated creatinine seems fairly consistent with previous.  Clinical Course as of Oct 04 2244  Tue Oct 05, 2019  2219 Await urinalysis, continue to await identification of possible source for fever   [MQ]    Clinical Course User Index [MQ] Delman Kitten, MD    ----------------------------------------- 10:45 PM on 10/05/2019 -----------------------------------------  Antibiotics ordered, code sepsis initiated.  Updated patient and family.  They are understanding, agreeable with plan for admission.  Patient is starting to improve, heart rate improving, resting comfortably normal capillary refill.  Sepsis reassessment completed.  Lactic acid pending  Admission discussed with Dr. Posey Pronto hospitalist service ____________________________________________   FINAL CLINICAL IMPRESSION(S) / ED DIAGNOSES  Final diagnoses:  Sepsis, due to unspecified organism, unspecified whether acute organ dysfunction present [facility])  Urinary tract infection, acute        Note:   This document was prepared using Dragon voice recognition software and may include unintentional dictation errors       Delman Kitten, MD 10/05/19 2311

## 2019-10-05 NOTE — Progress Notes (Signed)
CODE SEPSIS - PHARMACY COMMUNICATION  **Broad Spectrum Antibiotics should be administered within 1 hour of Sepsis diagnosis**  Time Code Sepsis Called/Page Received: 9/21 @ 22:42   Antibiotics Ordered: Ceftriaxone   Time of 1st antibiotic administration:  Ceftriaxone 1 gm IV X 1 on 9/21 @ 2249  Additional action taken by pharmacy: none   If necessary, Name of Provider/Nurse Contacted:     Brain Honeycutt D ,PharmD Clinical Pharmacist  10/05/2019  11:22 PM

## 2019-10-05 NOTE — ED Provider Notes (Signed)
[facility]    CSN: [id number] Arrival date & time: 10/05/19  4742      History   Chief Complaint Chief Complaint  Patient presents with   Fever   urinary symptoms   Altered Mental Status    HPI Eduardo Richards is a 79 y.o. male.   79 yo male here for evaluation of fever, confusion, and foul smelling odor to his urine since today.  EKG in triage shows A-fib with RVR.  Family advised that patient needs evaluation in the ER and EMS called for transport.      Past Medical History:  Diagnosis Date   Coronary artery disease    Dementia ([place])    Diabetes mellitus without complication ([place])    Hypertension    Stroke ([place])     There are no problems to display for this patient.   Past Surgical History:  Procedure Laterality Date   BACK SURGERY     CARPAL TUNNEL RELEASE Bilateral        Home Medications    Prior to Admission medications   Medication Sig Start Date End Date Taking? Authorizing Provider  apixaban (ELIQUIS) 2.5 MG TABS tablet TAKE 1 TABLET (2.5 MG TOTAL) BY MOUTH EVERY 12 (TWELVE) HOURS 12/21/18  Yes [provider]  aspirin 81 MG chewable tablet Chew by mouth.   Yes [provider]  atorvastatin (LIPITOR) 40 MG tablet Take by mouth. 04/28/12  Yes [provider]  busPIRone (BUSPAR) 5 MG tablet Take 1 tablet by mouth daily. 01/04/19  Yes [provider]  diltiazem (TIAZAC) 180 MG 24 hr capsule Take by mouth. 03/03/12 05/18/20 Yes [provider]  finasteride (PROSCAR) 5 MG tablet Take 1 tablet (5 mg total) by mouth daily. 10/05/18  Yes MacDiarmid, Nicki Reaper, MD  memantine (NAMENDA) 5 MG tablet Take by mouth. 08/16/18  Yes [provider]  metoprolol succinate (TOPROL-XL) 25 MG 24 hr tablet Take 25 mg by mouth daily.   Yes [provider]  mirabegron ER (MYRBETRIQ) 50 MG TB24 tablet TAKE 1 TABLET BY MOUTH EVERY DAY 06/15/18  Yes [provider]  RYBELSUS 3 MG TABS Take  1 tablet by mouth daily. 09/07/19  Yes [provider]  torsemide (DEMADEX) 20 MG tablet Take 20 mg by mouth 2 (two) times daily.   Yes [provider]  warfarin (COUMADIN) 4 MG tablet Take by mouth.   Yes [provider]  allopurinol (ZYLOPRIM) 100 MG tablet Take by mouth. 03/13/12 06/16/19  [provider]  buPROPion (WELLBUTRIN XL) 150 MG 24 hr tablet Take by mouth. 07/28/17 08/13/19  [provider]  diltiazem (CARDIZEM CD) 180 MG 24 hr capsule Take by mouth. 03/03/12 06/15/19  [provider]  ketoconazole (NIZORAL) 2 % shampoo APPLY ON THE SKIN DAILY TO [facility] UPPER BODY AND SCALP X 2 WEEKS THEN ONCE A MONTH AS MAINTENCE 06/25/11   [provider]  TRADJENTA 5 MG TABS tablet Take 5 mg by mouth daily. 07/14/18   [provider]    Family History Family History  Problem Relation Age of Onset   Cancer Mother    Cancer Father     Social History Social History   Tobacco Use   Smoking status: Former Smoker   Smokeless tobacco: Never Used  Scientific laboratory technician Use: Never used  Substance Use Topics   Alcohol use: Yes    Comment: occasionally   Drug use: Never     Allergies  Clindamycin, Glipizide, Tamsulosin, and Codeine   Review of Systems Review of Systems  Constitutional: Positive for activity change and fever.       Patient is lethargic but responsive in triage.   Psychiatric/Behavioral: Positive for decreased concentration.     Physical Exam Triage Vital Signs ED Triage Vitals  Enc Vitals Group     BP 10/05/19 1940 (!) 134/95     Pulse Rate 10/05/19 1940 (!) 130     Resp 10/05/19 1940 18     Temp 10/05/19 1940 (!) 100.7 F (38.2 C)     Temp Source 10/05/19 1940 Oral     SpO2 10/05/19 1940 94 %     Weight 10/05/19 1936 180 lb (81.6 kg)     Height 10/05/19 1936 5\' 9"  (1.753 m)     Head Circumference --      Peak Flow --      Pain Score 10/05/19 1935 0     Pain Loc --      Pain Edu? --       Excl. in [place]? --    No data found.  Updated Vital Signs BP (!) 134/95 (BP Location: Right Arm)    Pulse (!) 130    Temp (!) 100.7 F (38.2 C) (Oral)    Resp 18    Ht 5\' 9"  (1.753 m)    Wt 180 lb (81.6 kg)    SpO2 94%    BMI 26.58 kg/m   Visual Acuity Right Eye Distance:   Left Eye Distance:   Bilateral Distance:    Right Eye Near:   Left Eye Near:    Bilateral Near:     Physical Exam Constitutional:      Appearance: He is ill-appearing.  HENT:     Head: Normocephalic and atraumatic.  Cardiovascular:     Rate and Rhythm: Tachycardia present. Rhythm irregular.     Pulses: Normal pulses.     Heart sounds: Normal heart sounds.  Pulmonary:     Effort: Pulmonary effort is normal.     Breath sounds: Normal breath sounds.  Neurological:     Mental Status: He is disoriented.      UC Treatments / Results  Labs (all labs ordered are listed, but only abnormal results are displayed) Labs Reviewed - No data to display  EKG   Radiology No results found.  Procedures Procedures (including critical care time)  Medications Ordered in UC Medications - No data to display  Initial Impression / Assessment and Plan / UC Course  I have reviewed the triage vital signs and the nursing notes.  Pertinent labs & imaging results that were available during my care of the patient were reviewed by me and considered in my medical decision making (see chart for details).   Patient brought to UC by his wife for evaluation of AMS. Patient is lethargic but responsive.   EKG shows A-fib with RVR- Patient will require monitoring, work-up for AMS, and medications to lower heart rate. Recommended to family that he go to the hospital by EMS for further evaluation and treatment and they verbalize an understanding.   Report given to EMS by Nursing staff. Patient left facility in stable condition.  Final Clinical Impressions(s) / UC Diagnoses   Final diagnoses:  Atrial fibrillation, unspecified  type [facility])     Discharge Instructions     Go to [facility] for further evaluation and management of your atrial fibrillation with RVR.    ED Prescriptions  None     PDMP not reviewed this encounter.   Margarette Canada, NP 10/05/19 2022

## 2019-10-05 NOTE — ED Triage Notes (Signed)
Wife states patient has been confused all day and has had a fever. She does report that his urine has an odor.

## 2019-10-05 NOTE — ED Notes (Signed)
Wife is at bedside. As per wife, pt was diagnosed with a UTI and sepsis. Wife states pt came in and has dementia, but was acting more altered. Wife states pts baseline is alert to person and family, but not to time, place or situation.

## 2019-10-05 NOTE — ED Triage Notes (Signed)
Pt arrival via [facility] from [facility] urgent care due to possible sepsis alert. Pt went to [facility] urgent care due to increased confusion and possible UTI per wife. Pt has hx of dementia but per wife has been more confused today than usual and thus she took him to the urgent care. Per EMS, patient had temp of 101 oral, 94% on RA, and is in uncontrolled afib getting up to the 140's in rate. CBG 202, RR 30. Pt did not receive his meds for afib today. 500 mL's given in route by EMS.   Dr. Jacqualine Code at bedside.

## 2019-10-06 DIAGNOSIS — I4819 Other persistent atrial fibrillation: Secondary | ICD-10-CM

## 2019-10-06 DIAGNOSIS — N184 Chronic kidney disease, stage 4 (severe): Secondary | ICD-10-CM

## 2019-10-06 LAB — CBC
HCT: 37 % — ABNORMAL LOW (ref 39.0–52.0)
Hemoglobin: 13.3 g/dL (ref 13.0–17.0)
MCH: 33.1 pg (ref 26.0–34.0)
MCHC: 35.9 g/dL (ref 30.0–36.0)
MCV: 92 fL (ref 80.0–100.0)
Platelets: 112 10*3/uL — ABNORMAL LOW (ref 150–400)
RBC: 4.02 MIL/uL — ABNORMAL LOW (ref 4.22–5.81)
RDW: 14.2 % (ref 11.5–15.5)
WBC: 10.7 10*3/uL — ABNORMAL HIGH (ref 4.0–10.5)
nRBC: 0 % (ref 0.0–0.2)

## 2019-10-06 LAB — GLUCOSE, CAPILLARY
Glucose-Capillary: 126 mg/dL — ABNORMAL HIGH (ref 70–99)
Glucose-Capillary: 147 mg/dL — ABNORMAL HIGH (ref 70–99)
Glucose-Capillary: 140 mg/dL — ABNORMAL HIGH (ref 70–99)
Glucose-Capillary: 163 mg/dL — ABNORMAL HIGH (ref 70–99)

## 2019-10-06 LAB — BASIC METABOLIC PANEL
Anion gap: 12 (ref 5–15)
BUN: 53 mg/dL — ABNORMAL HIGH (ref 8–23)
CO2: 22 mmol/L (ref 22–32)
Calcium: 9.3 mg/dL (ref 8.9–10.3)
Chloride: 103 mmol/L (ref 98–111)
Creatinine, Ser: 2.86 mg/dL — ABNORMAL HIGH (ref 0.61–1.24)
GFR calc Af Amer: 23 mL/min — ABNORMAL LOW (ref >60)
GFR calc non Af Amer: 20 mL/min — ABNORMAL LOW (ref >60)
Glucose, Bld: 129 mg/dL — ABNORMAL HIGH (ref 70–99)
Potassium: 3.7 mmol/L (ref 3.5–5.1)
Sodium: 137 mmol/L (ref 135–145)

## 2019-10-06 LAB — TSH: TSH: 0.584 u[IU]/mL (ref 0.350–4.500)

## 2019-10-06 LAB — MAGNESIUM
Magnesium: 1.9 mg/dL (ref 1.7–2.4)
Magnesium: 2.1 mg/dL (ref 1.7–2.4)

## 2019-10-06 MED ORDER — ONDANSETRON HCL 4 MG PO TABS: 4.0000 mg | ORAL_TABLET | Freq: Four times a day (QID) | ORAL | Status: DC | PRN

## 2019-10-06 MED ORDER — ACETAMINOPHEN 325 MG PO TABS: 650.0000 mg | ORAL_TABLET | Freq: Four times a day (QID) | ORAL | Status: DC | PRN

## 2019-10-06 MED ORDER — POTASSIUM CHLORIDE 2 MEQ/ML IV SOLN
INTRAVENOUS | Status: DC
  Filled 2019-10-06 (×2): qty 1000

## 2019-10-06 MED ORDER — ONDANSETRON HCL 4 MG/2ML IJ SOLN: 4.0000 mg | Freq: Four times a day (QID) | INTRAMUSCULAR | Status: DC | PRN

## 2019-10-06 MED ORDER — ASPIRIN EC 81 MG PO TBEC
81.0000 mg | DELAYED_RELEASE_TABLET | Freq: Every day | ORAL | Status: DC
  Administered 2019-10-06 – 2019-10-12 (×7): 81 mg via ORAL
  Filled 2019-10-06 (×7): qty 1

## 2019-10-06 MED ORDER — ACETAMINOPHEN 650 MG RE SUPP: 650.0000 mg | Freq: Four times a day (QID) | RECTAL | Status: DC | PRN

## 2019-10-06 MED ORDER — ATORVASTATIN CALCIUM 20 MG PO TABS
40.0000 mg | ORAL_TABLET | Freq: Every day | ORAL | Status: DC
  Administered 2019-10-06 – 2019-10-12 (×7): 40 mg via ORAL
  Filled 2019-10-06 (×7): qty 2

## 2019-10-06 MED ORDER — DILTIAZEM HCL ER COATED BEADS 180 MG PO CP24
180.0000 mg | ORAL_CAPSULE | Freq: Every day | ORAL | Status: DC
  Administered 2019-10-06 – 2019-10-12 (×7): 180 mg via ORAL
  Filled 2019-10-06 (×7): qty 1

## 2019-10-06 MED ORDER — METOPROLOL TARTRATE 5 MG/5ML IV SOLN: 5.0000 mg | Freq: Once | INTRAVENOUS | Status: DC | PRN

## 2019-10-06 MED ORDER — FINASTERIDE 5 MG PO TABS
5.0000 mg | ORAL_TABLET | Freq: Every day | ORAL | Status: DC
  Administered 2019-10-06 – 2019-10-12 (×7): 5 mg via ORAL
  Filled 2019-10-06 (×7): qty 1

## 2019-10-06 MED ORDER — MEMANTINE HCL 5 MG PO TABS
5.0000 mg | ORAL_TABLET | Freq: Two times a day (BID) | ORAL | Status: DC
  Administered 2019-10-06 – 2019-10-12 (×13): 5 mg via ORAL
  Filled 2019-10-06 (×13): qty 1

## 2019-10-06 MED ORDER — POTASSIUM CHLORIDE 10 MEQ/100ML IV SOLN
10.0000 meq | INTRAVENOUS | Status: AC
  Administered 2019-10-06 (×2): 10 meq via INTRAVENOUS
  Filled 2019-10-06: qty 100

## 2019-10-06 MED ORDER — SODIUM CHLORIDE 0.9% FLUSH
3.0000 mL | Freq: Two times a day (BID) | INTRAVENOUS | Status: DC
  Administered 2019-10-06 – 2019-10-12 (×14): 3 mL via INTRAVENOUS

## 2019-10-06 MED ORDER — APIXABAN 2.5 MG PO TABS
2.5000 mg | ORAL_TABLET | Freq: Two times a day (BID) | ORAL | Status: DC
  Administered 2019-10-06 – 2019-10-12 (×13): 2.5 mg via ORAL
  Filled 2019-10-06 (×14): qty 1

## 2019-10-06 MED ORDER — METOPROLOL SUCCINATE ER 50 MG PO TB24
25.0000 mg | ORAL_TABLET | Freq: Every day | ORAL | Status: DC
  Administered 2019-10-06 – 2019-10-12 (×7): 25 mg via ORAL
  Filled 2019-10-06 (×7): qty 1

## 2019-10-06 MED ORDER — INSULIN ASPART 100 UNIT/ML ~~LOC~~ SOLN
0.0000 [IU] | Freq: Three times a day (TID) | SUBCUTANEOUS | Status: DC
  Administered 2019-10-06 (×3): 1 [IU] via SUBCUTANEOUS
  Administered 2019-10-07 – 2019-10-09 (×2): 2 [IU] via SUBCUTANEOUS
  Administered 2019-10-10: 1 [IU] via SUBCUTANEOUS
  Administered 2019-10-10 – 2019-10-12 (×3): 2 [IU] via SUBCUTANEOUS
  Filled 2019-10-06 (×9): qty 1

## 2019-10-06 MED ORDER — SODIUM CHLORIDE 0.9 % IV SOLN
1.0000 g | INTRAVENOUS | Status: DC
  Administered 2019-10-06 – 2019-10-09 (×3): 1 g via INTRAVENOUS
  Filled 2019-10-06 (×2): qty 1
  Filled 2019-10-06: qty 10
  Filled 2019-10-06: qty 1

## 2019-10-06 NOTE — ED Notes (Signed)
Pt transported to floor with wife. Pt on stretcher and monitor.

## 2019-10-06 NOTE — Progress Notes (Signed)
PROGRESS NOTE    Eduardo Richards  CVE:[id number] DOB: Oct 09, 1940 DOA: 10/05/2019 PCP: [name], MD   Chief complaint: Altered mental status  Brief Narrative:  Eduardo Richards is a 79 y.o. male with medical history significant for persistent atrial fibrillation on Eliquis, CKD stage IV, CAD, history of CVA, T2DM, HTN, HLD, BPH, and mixed Alzheimer's and vascular dementia who presents to the ED for evaluation of confusion, fevers, and A. fib with RVR.   He had a heart rate of 101 in the emergency room.  HR 145 with atrial fibrillation RVR.  Urine test was abnormal.  Urine culture and blood cultures are sent out.  He is started on Rocephin for urinary tract infection.  9/22.  Blood cultures and urine culture still pending.  Continue Rocephin.  Mental status improved today.   Assessment & Plan:   Principal Problem:   Sepsis due to urinary tract infection ([place]) Active Problems:   Coronary artery disease   Diabetes mellitus without complication ([place])   Dementia ([place])   Hypertension associated with diabetes ([place])   Hyperlipidemia associated with type 2 diabetes mellitus ([place])   CKD (chronic kidney disease), stage IV (HCC)   Hypokalemia   Persistent atrial fibrillation ([place])  #1.  Sepsis with urinary tract infection. Patient met sepsis criteria at time of admission with fever, tachycardia and altered mental status.  Urine culture is sent out.  Pending blood culture.  Lactic acid level was normal, hemodynamically stable.  Patient appetite has been improving today, will discontinue IV fluids. Bladder scan to rule out any urinary retention.  2.  Permanent atrial fibrillation with rapid ventricle response. Heart rate much better today.  Continue anticoagulation with Eliquis.  3.  Acute metabolic cephalopathy with delirium with baseline dementia. Mental status much better today.  4.  Type 2 diabetes per Continue sliding scale insulin for now.  5.  Essential  hypertension. Continue home medicines.  6.  Chronic kidney disease stage IV. Continue to follow.  7.  Mild thrombocytopenia. Follow.      DVT prophylaxis: Eliquis Code Status: DNR Family Communication: Updated wife .   Status is: Inpatient  Remains inpatient appropriate because:Inpatient level of care appropriate due to severity of illness patient still has significant infection, receiving IV antibiotics.     Dispo: The patient is from: Home              Anticipated d/c is to: Home              Anticipated d/c date is: 1-2 days              Patient currently is not medically stable to d/c.        I/O last 3 completed shifts: In: 600 [IV Piggyback:600] Out: -  Total I/O In: 360 [P.O.:360] Out: -      Consultants:   none  Procedures: None  Antimicrobials: Rocephin  Subjective: Patient is less confused today.  No agitation.  Denies any short of breath or cough. No additional fever. No abdominal pain nausea vomiting.  No diarrhea. Chronically incontinent of urine.  No dysuria hematuria.  Objective: Vitals:   10/06/19 0329 10/06/19 0539 10/06/19 0723 10/06/19 1242  BP: (!) 141/96 122/76 131/89 (!) 133/94  Pulse: 94 97 96 93  Resp: 16 16 16 20   Temp: 97.9 F (36.6 C) 97.8 F (36.6 C) 98.3 F (36.8 C) 98.8 F (37.1 C)  TempSrc: Oral Oral Oral Oral  SpO2: 99% 96% 97% 99%  Weight:      Height:        Intake/Output Summary (Last 24 hours) at 10/06/2019 1331 Last data filed at 10/06/2019 0900 Gross per 24 hour  Intake 960 ml  Output --  Net 960 ml   Filed Weights   10/05/19 2116  Weight: 81 kg    Examination:  General exam: Appears calm and comfortable  Respiratory system: Clear to auscultation. Respiratory effort normal. Cardiovascular system: Irregular, no JVD, murmurs, rubs, gallops or clicks. No pedal edema. Gastrointestinal system: Abdomen is nondistended, soft and nontender. No organomegaly or masses felt. Normal bowel sounds  heard. Central nervous system: Alert and oriented x2. No focal neurological deficits. Extremities: Symmetric  Skin: No rashes, lesions or ulcers Psychiatry: Mood & affect appropriate.     Data Reviewed: I have personally reviewed following labs and imaging studies  CBC: Recent Labs  Lab 10/05/19 2115 10/06/19 0418  WBC 11.2* 10.7*  NEUTROABS 9.7*  --   HGB 13.9 13.3  HCT 41.0 37.0*  MCV 94.7 92.0  PLT 123* 542*   Basic Metabolic Panel: Recent Labs  Lab 10/05/19 2115 10/06/19 0418  NA 136 137  K 3.0* 3.7  CL 101 103  CO2 22 22  GLUCOSE 161* 129*  BUN 53* 53*  CREATININE 2.87* 2.86*  CALCIUM 9.1 9.3  MG 1.9 2.1   GFR: Estimated Creatinine Clearance: 20.9 mL/min (A) (by C-G formula based on SCr of 2.86 mg/dL (H)). Liver Function Tests: Recent Labs  Lab 10/05/19 2115  AST 32  ALT 30  ALKPHOS 109  BILITOT 1.5*  PROT 6.0*  ALBUMIN 2.5*   No results for input(s): LIPASE, AMYLASE in the last 168 hours. No results for input(s): AMMONIA in the last 168 hours. Coagulation Profile: Recent Labs  Lab 10/05/19 2115  INR 1.4*   Cardiac Enzymes: No results for input(s): CKTOTAL, CKMB, CKMBINDEX, TROPONINI in the last 168 hours. BNP (last 3 results) No results for input(s): PROBNP in the last 8760 hours. HbA1C: No results for input(s): HGBA1C in the last 72 hours. CBG: Recent Labs  Lab 10/06/19 0724 10/06/19 1135  GLUCAP 126* 147*   Lipid Profile: No results for input(s): CHOL, HDL, LDLCALC, TRIG, CHOLHDL, LDLDIRECT in the last 72 hours. Thyroid Function Tests: Recent Labs    10/06/19 0418  TSH 0.584   Anemia Panel: No results for input(s): VITAMINB12, FOLATE, FERRITIN, TIBC, IRON, RETICCTPCT in the last 72 hours. Sepsis Labs: Recent Labs  Lab 10/05/19 2115 10/05/19 2116  LATICACIDVEN 1.1 1.0    Recent Results (from the past 240 hour(s))  Blood Culture (routine x 2)     Status: None (Preliminary result)   Collection Time: 10/05/19  9:15 PM    Specimen: BLOOD  Result Value Ref Range Status   Specimen Description BLOOD RTHAND  Final   Special Requests   Final    BOTTLES DRAWN AEROBIC AND ANAEROBIC Blood Culture results may not be optimal due to an inadequate volume of blood received in culture bottles   Culture   Final    NO GROWTH < 12 HOURS Performed at [facility], [address] [place], [place] [address]    Report Status PENDING  Incomplete  Blood Culture (routine x 2)     Status: None (Preliminary result)   Collection Time: 10/05/19  9:15 PM   Specimen: BLOOD  Result Value Ref Range Status   Specimen Description BLOOD LFOA  Final   Special Requests   Final    BOTTLES  DRAWN AEROBIC AND ANAEROBIC Blood Culture adequate volume   Culture   Final    NO GROWTH < 12 HOURS Performed at [facility], [address] [place], [place] [address]    Report Status PENDING  Incomplete  SARS Coronavirus 2 by RT PCR (hospital order, performed in [facility] hospital lab) Nasopharyngeal Nasopharyngeal Swab     Status: None   Collection Time: 10/05/19  9:15 PM   Specimen: Nasopharyngeal Swab  Result Value Ref Range Status   SARS Coronavirus 2 NEGATIVE NEGATIVE Final    Comment: (NOTE) SARS-CoV-2 target nucleic acids are NOT DETECTED.  The SARS-CoV-2 RNA is generally detectable in upper and lower respiratory specimens during the acute phase of infection. The lowest concentration of SARS-CoV-2 viral copies this assay can detect is 250 copies / mL. A negative result does not preclude SARS-CoV-2 infection and should not be used as the sole basis for treatment or other patient management decisions.  A negative result may occur with improper specimen collection / handling, submission of specimen other than nasopharyngeal swab, presence of viral mutation(s) within the areas targeted by this assay, and inadequate number of viral copies (<250 copies / mL). A negative result must be combined with  clinical observations, patient history, and epidemiological information.  Fact Sheet for Patients:   StrictlyIdeas.no  Fact Sheet for Healthcare Providers: BankingDealers.co.za  This test is not yet approved or  cleared by the [place] FDA and has been authorized for detection and/or diagnosis of SARS-CoV-2 by FDA under an Emergency Use Authorization (EUA).  This EUA will remain in effect (meaning this test can be used) for the duration of the COVID-19 declaration under Section 564(b)(1) of the Act, 21 U.S.C. section 360bbb-3(b)(1), unless the authorization is terminated or revoked sooner.  Performed at [facility], [address], [place], [place] [address]          Radiology Studies: CT Head Wo Contrast  Result Date: 10/05/2019 CLINICAL DATA:  Increasing confusion and possible sepsis EXAM: CT HEAD WITHOUT CONTRAST TECHNIQUE: Contiguous axial images were obtained from the base of the skull through the vertex without intravenous contrast. COMPARISON:  12/14/2018 FINDINGS: Brain: Chronic atrophic and ischemic changes are noted. These are stable in appearance from the prior exam. Old lacunar infarcts are noted primarily on the right stable from the prior study. Prior infarct in the right posterior parietal region is noted as well. Vascular: No hyperdense vessel or unexpected calcification. Skull: Normal. Negative for fracture or focal lesion. Sinuses/Orbits: Near complete opacification of the left maxillary antrum is noted consistent with acute sinus disease. Similar changes are noted sphenoid sinus on the left. Other: None IMPRESSION: Chronic atrophic and ischemic changes.  No acute infarct is noted. Changes consistent with acute sinusitis involving the left maxillary and sphenoid sinuses. Electronically Signed   By: Inez Catalina M.D.   On: 10/05/2019 22:36   DG Chest Port 1 View  Result Date: 10/05/2019 CLINICAL DATA:   Fever, sepsis EXAM: PORTABLE CHEST 1 VIEW COMPARISON:  09/19/2017 FINDINGS: Single frontal view of the chest demonstrates an enlarged cardiac silhouette, likely exaggerated by portable technique and AP positioning. No airspace disease, effusion, or pneumothorax. IMPRESSION: 1. Enlarged cardiac silhouette. 2. No acute airspace disease. Electronically Signed   By: Randa Ngo M.D.   On: 10/05/2019 21:42        Scheduled Meds: . apixaban  2.5 mg Oral BID  . aspirin EC  81 mg Oral Daily  . atorvastatin  40  mg Oral Daily  . diltiazem  180 mg Oral Daily  . finasteride  5 mg Oral Daily  . insulin aspart  0-9 Units Subcutaneous TID WC  . memantine  5 mg Oral BID  . metoprolol succinate  25 mg Oral Daily  . sodium chloride flush  3 mL Intravenous Q12H   Continuous Infusions: . cefTRIAXone (ROCEPHIN)  IV       LOS: 1 day    Time spent: 28 minutes    Sharen Hones, MD [facility]   To contact the attending provider between 7A-7P or the covering provider during after hours 7P-7A, please log into the web site [email or web address] and access using universal [place] password for that web site. If you do not have the password, please call the hospital operator.  10/06/2019, 1:31 PM

## 2019-10-06 NOTE — Plan of Care (Signed)
  Problem: Education: Goal: Knowledge of General Education information will improve Description: Including pain rating scale, medication(s)/side effects and non-pharmacologic comfort measures Outcome: Progressing   Problem: Safety: Goal: Ability to remain free from injury will improve Outcome: Progressing   Problem: Skin Integrity: Goal: Risk for impaired skin integrity will decrease Outcome: Progressing   

## 2019-10-07 ENCOUNTER — Inpatient Hospital Stay: Payer: Medicare Other

## 2019-10-07 DIAGNOSIS — E1159 Type 2 diabetes mellitus with other circulatory complications: Secondary | ICD-10-CM

## 2019-10-07 DIAGNOSIS — I10 Essential (primary) hypertension: Secondary | ICD-10-CM

## 2019-10-07 LAB — CBC WITH DIFFERENTIAL/PLATELET
Abs Immature Granulocytes: 0.1 10*3/uL — ABNORMAL HIGH (ref 0.00–0.07)
Basophils Absolute: 0 10*3/uL (ref 0.0–0.1)
Basophils Relative: 0 %
Eosinophils Absolute: 0.1 10*3/uL (ref 0.0–0.5)
Eosinophils Relative: 1 %
HCT: 37.6 % — ABNORMAL LOW (ref 39.0–52.0)
Hemoglobin: 12.8 g/dL — ABNORMAL LOW (ref 13.0–17.0)
Immature Granulocytes: 1 %
Lymphocytes Relative: 13 %
Lymphs Abs: 1.2 10*3/uL (ref 0.7–4.0)
MCH: 32.5 pg (ref 26.0–34.0)
MCHC: 34 g/dL (ref 30.0–36.0)
MCV: 95.4 fL (ref 80.0–100.0)
Monocytes Absolute: 0.9 10*3/uL (ref 0.1–1.0)
Monocytes Relative: 10 %
Neutro Abs: 6.7 10*3/uL (ref 1.7–7.7)
Neutrophils Relative %: 75 %
Platelets: 116 10*3/uL — ABNORMAL LOW (ref 150–400)
RBC: 3.94 MIL/uL — ABNORMAL LOW (ref 4.22–5.81)
RDW: 14.2 % (ref 11.5–15.5)
WBC: 9 10*3/uL (ref 4.0–10.5)
nRBC: 0 % (ref 0.0–0.2)

## 2019-10-07 LAB — BASIC METABOLIC PANEL
Anion gap: 8 (ref 5–15)
BUN: 48 mg/dL — ABNORMAL HIGH (ref 8–23)
CO2: 24 mmol/L (ref 22–32)
Calcium: 9.2 mg/dL (ref 8.9–10.3)
Chloride: 106 mmol/L (ref 98–111)
Creatinine, Ser: 2.52 mg/dL — ABNORMAL HIGH (ref 0.61–1.24)
GFR calc Af Amer: 27 mL/min — ABNORMAL LOW (ref >60)
GFR calc non Af Amer: 23 mL/min — ABNORMAL LOW (ref >60)
Glucose, Bld: 109 mg/dL — ABNORMAL HIGH (ref 70–99)
Potassium: 3.5 mmol/L (ref 3.5–5.1)
Sodium: 138 mmol/L (ref 135–145)

## 2019-10-07 LAB — MAGNESIUM: Magnesium: 2.2 mg/dL (ref 1.7–2.4)

## 2019-10-07 LAB — GLUCOSE, CAPILLARY
Glucose-Capillary: 105 mg/dL — ABNORMAL HIGH (ref 70–99)
Glucose-Capillary: 154 mg/dL — ABNORMAL HIGH (ref 70–99)
Glucose-Capillary: 108 mg/dL — ABNORMAL HIGH (ref 70–99)
Glucose-Capillary: 124 mg/dL — ABNORMAL HIGH (ref 70–99)

## 2019-10-07 LAB — URINE CULTURE: Culture: >=100000 — AB

## 2019-10-07 NOTE — Progress Notes (Signed)
PROGRESS NOTE    Eduardo Richards  WNU:[id number] DOB: Jan 22, 1940 DOA: 10/05/2019 PCP: [name], MD  Complaint.  Generalized weakness.  Brief Narrative:   Eduardo Richards a 79 y.o.malewith medical history significant forpersistentatrial fibrillation on Eliquis, CKD stage IV, CAD, history of CVA, T2DM, HTN, HLD, BPH, and mixed Alzheimer's and vascular dementia who presents to the ED for evaluation of confusion, fevers, and A. fib with RVR.  He had a heart rate of 101 in the emergency room.  HR 145 with atrial fibrillation RVR.  Urine test was abnormal.  Urine culture and blood cultures are sent out.  He is started on Rocephin for urinary tract infection.  9/22.  Blood cultures and urine culture still pending.  Continue Rocephin.  Mental status improved today.  9/23.  Blood culture negative, urine culture positive for E. coli.  Patient still has a fever, not ready for discharge.  Obtain renal ultrasound.  Assessment & Plan:   Principal Problem:   Sepsis due to urinary tract infection ([place]) Active Problems:   Coronary artery disease   Diabetes mellitus without complication ([place])   Dementia ([place])   Hypertension associated with diabetes ([place])   Hyperlipidemia associated with type 2 diabetes mellitus ([place])   CKD (chronic kidney disease), stage IV (HCC)   Hypokalemia   Persistent atrial fibrillation ([place])  #1.  Sepsis with urinary tract infection. Bladder scan did not show any residual.  Patient still has a fever overnight, blood culture came back no growth.  Urine culture positive for E. coli susceptible to Rocephin.  Continue Rocephin for now. Due to persistent fever after treatment, will obtain renal ultrasound to rule out complications.  Also check a procalcitonin level.  2.  Permanent atrial fibrillation with rapid immature response. Heart rate much better.  Eliquis.  3.  Acute metabolic encephalopathy with delirium with baseline dementia. Patient  still has some confusion, no agitation.  4.  Type 2 diabetes. Continue sliding scale insulin.  5.  Essential hypertension. Continue home medicines.  6.  Chronic kidney disease stage IV.Marland Kitchen  Renal function stable.  7.  Mild thrombocytopenia.  Still stable.  Recheck a level tomorrow.  Obtain PT eval and treat.  DVT prophylaxis: Eliquis Code Status: DNR Family Communication: wife updated.  Disposition Plan:  .   Status is: Inpatient  Remains inpatient appropriate because:Inpatient level of care appropriate due to severity of illness, receiving antibiotics IV, still has a fever.   Dispo: The patient is from: Home              Anticipated d/c is to: Home              Anticipated d/c date is: 1 day              Patient currently is not medically stable to d/c.        I/O last 3 completed shifts: In: 1876.3 [P.O.:360; I.V.:916.3; IV Piggyback:600] Out: 601 [Urine:600; Stool:1] No intake/output data recorded.     Consultants:   none  Procedures: none  Antimicrobials: rocephin  Subjective: Patient still confused at the baseline.  Does not complain any short of breath or cough. He does not have dysuria hematuria, bladder scan with 41 mL of residual. He had a low-grade fever last evening at 100.6.  No chills. He does not have any abdominal pain or nausea vomiting or diarrhea.   Objective: Vitals:   10/06/19 2358 10/07/19 0414 10/07/19 0754 10/07/19 1153  BP: 134/87 126/68 Marland Kitchen)  155/81 131/80  Pulse: 84 88 85 86  Resp: 17 18 18 18   Temp: 99.2 F (37.3 C) 98.5 F (36.9 C) 98.6 F (37 C) 97.8 F (36.6 C)  TempSrc: Oral Oral Oral Oral  SpO2: 93% (!) 89% 93% 94%  Weight:      Height:        Intake/Output Summary (Last 24 hours) at 10/07/2019 1218 Last data filed at 10/06/2019 2203 Gross per 24 hour  Intake 916.26 ml  Output 601 ml  Net 315.26 ml   Filed Weights   10/05/19 2116  Weight: 81 kg    Examination:  General exam: Appears calm and comfortable    Respiratory system: Clear to auscultation. Respiratory effort normal. Cardiovascular system: Irregular.  No JVD, murmurs, rubs, gallops or clicks. No pedal edema. Gastrointestinal system: Abdomen is nondistended, soft and nontender. No organomegaly or masses felt. Normal bowel sounds heard. Central nervous system: Alert and oriented x1. No focal neurological deficits. Extremities: Symmetric 5 x 5 power. Skin: No rashes, lesions or ulcers Psychiatry:  Mood & affect appropriate.     Data Reviewed: I have personally reviewed following labs and imaging studies  CBC: Recent Labs  Lab 10/05/19 2115 10/06/19 0418 10/07/19 0620  WBC 11.2* 10.7* 9.0  NEUTROABS 9.7*  --  6.7  HGB 13.9 13.3 12.8*  HCT 41.0 37.0* 37.6*  MCV 94.7 92.0 95.4  PLT 123* 112* 161*   Basic Metabolic Panel: Recent Labs  Lab 10/05/19 2115 10/06/19 0418 10/07/19 0620  NA 136 137 138  K 3.0* 3.7 3.5  CL 101 103 106  CO2 22 22 24   GLUCOSE 161* 129* 109*  BUN 53* 53* 48*  CREATININE 2.87* 2.86* 2.52*  CALCIUM 9.1 9.3 9.2  MG 1.9 2.1 2.2   GFR: Estimated Creatinine Clearance: 23.8 mL/min (A) (by C-G formula based on SCr of 2.52 mg/dL (H)). Liver Function Tests: Recent Labs  Lab 10/05/19 2115  AST 32  ALT 30  ALKPHOS 109  BILITOT 1.5*  PROT 6.0*  ALBUMIN 2.5*   No results for input(s): LIPASE, AMYLASE in the last 168 hours. No results for input(s): AMMONIA in the last 168 hours. Coagulation Profile: Recent Labs  Lab 10/05/19 2115  INR 1.4*   Cardiac Enzymes: No results for input(s): CKTOTAL, CKMB, CKMBINDEX, TROPONINI in the last 168 hours. BNP (last 3 results) No results for input(s): PROBNP in the last 8760 hours. HbA1C: No results for input(s): HGBA1C in the last 72 hours. CBG: Recent Labs  Lab 10/06/19 1135 10/06/19 1651 10/06/19 2124 10/07/19 0754 10/07/19 1154  GLUCAP 147* 140* 163* 105* 154*   Lipid Profile: No results for input(s): CHOL, HDL, LDLCALC, TRIG, CHOLHDL,  LDLDIRECT in the last 72 hours. Thyroid Function Tests: Recent Labs    10/06/19 0418  TSH 0.584   Anemia Panel: No results for input(s): VITAMINB12, FOLATE, FERRITIN, TIBC, IRON, RETICCTPCT in the last 72 hours. Sepsis Labs: Recent Labs  Lab 10/05/19 2115 10/05/19 2116  LATICACIDVEN 1.1 1.0    Recent Results (from the past 240 hour(s))  Blood Culture (routine x 2)     Status: None (Preliminary result)   Collection Time: 10/05/19  9:15 PM   Specimen: BLOOD  Result Value Ref Range Status   Specimen Description BLOOD RTHAND  Final   Special Requests   Final    BOTTLES DRAWN AEROBIC AND ANAEROBIC Blood Culture results may not be optimal due to an inadequate volume of blood received in culture bottles   Culture  Final    NO GROWTH 2 DAYS Performed at [facility], [place], [place] [address]    Report Status PENDING  Incomplete  Blood Culture (routine x 2)     Status: None (Preliminary result)   Collection Time: 10/05/19  9:15 PM   Specimen: BLOOD  Result Value Ref Range Status   Specimen Description BLOOD LFOA  Final   Special Requests   Final    BOTTLES DRAWN AEROBIC AND ANAEROBIC Blood Culture adequate volume   Culture   Final    NO GROWTH 2 DAYS Performed at [facility], [address], [place], [place] [address]    Report Status PENDING  Incomplete  Urine culture     Status: Abnormal   Collection Time: 10/05/19  9:15 PM   Specimen: In/Out Cath Urine  Result Value Ref Range Status   Specimen Description   Final    IN/OUT CATH URINE Performed at [facility], [address] [place], [place] [address]    Special Requests   Final    NONE Performed at [facility], [address] [place], [place] [address]    Culture >=100,000 COLONIES/mL ESCHERICHIA COLI (A)  Final   Report Status 10/07/2019 FINAL  Final   Organism ID, Bacteria ESCHERICHIA COLI (A)  Final      Susceptibility   Escherichia coli - MIC*     AMPICILLIN >=32 RESISTANT Resistant     CEFAZOLIN 8 SENSITIVE Sensitive     CEFTRIAXONE <=0.25 SENSITIVE Sensitive     CIPROFLOXACIN >=4 RESISTANT Resistant     GENTAMICIN <=1 SENSITIVE Sensitive     IMIPENEM <=0.25 SENSITIVE Sensitive     NITROFURANTOIN <=16 SENSITIVE Sensitive     TRIMETH/SULFA >=320 RESISTANT Resistant     AMPICILLIN/SULBACTAM >=32 RESISTANT Resistant     PIP/TAZO <=4 SENSITIVE Sensitive     * >=100,000 COLONIES/mL ESCHERICHIA COLI  SARS Coronavirus 2 by RT PCR (hospital order, performed in [place] hospital lab) Nasopharyngeal Nasopharyngeal Swab     Status: None   Collection Time: 10/05/19  9:15 PM   Specimen: Nasopharyngeal Swab  Result Value Ref Range Status   SARS Coronavirus 2 NEGATIVE NEGATIVE Final    Comment: (NOTE) SARS-CoV-2 target nucleic acids are NOT DETECTED.  The SARS-CoV-2 RNA is generally detectable in upper and lower respiratory specimens during the acute phase of infection. The lowest concentration of SARS-CoV-2 viral copies this assay can detect is 250 copies / mL. A negative result does not preclude SARS-CoV-2 infection and should not be used as the sole basis for treatment or other patient management decisions.  A negative result may occur with improper specimen collection / handling, submission of specimen other than nasopharyngeal swab, presence of viral mutation(s) within the areas targeted by this assay, and inadequate number of viral copies (<250 copies / mL). A negative result must be combined with clinical observations, patient history, and epidemiological information.  Fact Sheet for Patients:   StrictlyIdeas.no  Fact Sheet for Healthcare Providers: BankingDealers.co.za  This test is not yet approved or  cleared by the [place] FDA and has been authorized for detection and/or diagnosis of SARS-CoV-2 by FDA under an Emergency Use Authorization (EUA).  This EUA will remain in  effect (meaning this test can be used) for the duration of the COVID-19 declaration under Section 564(b)(1) of the Act, 21 U.S.C. section 360bbb-3(b)(1), unless the authorization is terminated or revoked sooner.  Performed at [facility], [address] [place], [place] [address]          Radiology Studies: CT Head Wo Contrast  Result Date: 10/05/2019 CLINICAL DATA:  Increasing confusion and possible sepsis EXAM: CT HEAD WITHOUT CONTRAST TECHNIQUE: Contiguous axial images were obtained from the base of the skull through the vertex without intravenous contrast. COMPARISON:  12/14/2018 FINDINGS: Brain: Chronic atrophic and ischemic changes are noted. These are stable in appearance from the prior exam. Old lacunar infarcts are noted primarily on the right stable from the prior study. Prior infarct in the right posterior parietal region is noted as well. Vascular: No hyperdense vessel or unexpected calcification. Skull: Normal. Negative for fracture or focal lesion. Sinuses/Orbits: Near complete opacification of the left maxillary antrum is noted consistent with acute sinus disease. Similar changes are noted sphenoid sinus on the left. Other: None IMPRESSION: Chronic atrophic and ischemic changes.  No acute infarct is noted. Changes consistent with acute sinusitis involving the left maxillary and sphenoid sinuses. Electronically Signed   By: Inez Catalina M.D.   On: 10/05/2019 22:36   DG Chest Port 1 View  Result Date: 10/05/2019 CLINICAL DATA:  Fever, sepsis EXAM: PORTABLE CHEST 1 VIEW COMPARISON:  09/19/2017 FINDINGS: Single frontal view of the chest demonstrates an enlarged cardiac silhouette, likely exaggerated by portable technique and AP positioning. No airspace disease, effusion, or pneumothorax. IMPRESSION: 1. Enlarged cardiac silhouette. 2. No acute airspace disease. Electronically Signed   By: Randa Ngo M.D.   On: 10/05/2019 21:42        Scheduled Meds: . apixaban   2.5 mg Oral BID  . aspirin EC  81 mg Oral Daily  . atorvastatin  40 mg Oral Daily  . diltiazem  180 mg Oral Daily  . finasteride  5 mg Oral Daily  . insulin aspart  0-9 Units Subcutaneous TID WC  . memantine  5 mg Oral BID  . metoprolol succinate  25 mg Oral Daily  . sodium chloride flush  3 mL Intravenous Q12H   Continuous Infusions: . cefTRIAXone (ROCEPHIN)  IV 1 g (10/06/19 2212)     LOS: 2 days    Time spent: 27 minutes    Sharen Hones, MD [facility]   To contact the attending provider between 7A-7P or the covering provider during after hours 7P-7A, please log into the web site [email or web address] and access using universal [place] password for that web site. If you do not have the password, please call the hospital operator.  10/07/2019, 12:18 PM

## 2019-10-07 NOTE — Evaluation (Signed)
Physical Therapy Evaluation Patient Details Name: Eduardo Richards MRN: [id number] DOB: 02/25/1940 Today's Date: 10/07/2019   History of Present Illness  Eduardo Richards is a 79 y.o. male with medical history significant for persistent atrial fibrillation on Eliquis, CKD stage IV, CAD, history of CVA, T2DM, HTN, HLD, BPH, and mixed Alzheimer's and vascular dementia who presents to the ED for evaluation of confusion, fevers, and A. fib with RVR. MD assessment includes sepsis, UTI, rapid articular fib and persisant artial fibrallatrion  Clinical Impression  Pt was found in bed with wife at bedside. Pt was oriented to self but was very lethargic. Pt required stimulation to be alert but would quickly close his eyes when not actively receiving stimulation. Pt's oxygen remained in the mid 90s treatment. Pt was able to come to sitting with MinA but required bilateral UE and PT assist to remain sitting up. Pt was able to complete STS transfer with min guard but immediately self-initiated sitting down in bed. Pt asked several times, "is it over?". Pt will benefit from PT services in a SNF setting upon discharge to safely address deficits listed in patient problem list for decreased caregiver assistance and eventual return to PLOF.     Follow Up Recommendations SNF    Equipment Recommendations  None recommended by PT    Recommendations for Other Services       Precautions / Restrictions Precautions Precautions: Fall Restrictions Weight Bearing Restrictions: No      Mobility  Bed Mobility Overal bed mobility: Needs Assistance Bed Mobility: Rolling;Supine to Sit;Sit to Supine Rolling: Min assist (pt requires tactile cues and some assisatnce to roll L and R. use of bed rails)   Supine to sit: Min assist (pt able to lift trunk and move legs off the bed but requires PT to finish the movement to come fully sitting up. pt requires max cueing to complete) Sit to supine: Mod assist       Transfers Overall transfer level: Needs assistance Equipment used: Rolling walker (2 wheeled) Transfers: Sit to/from Stand Sit to Stand: Min guard         General transfer comment: pt able to come to standing but quickly self initiates sitting  Ambulation/Gait             General Gait Details: unsafe to attempt  Stairs            Wheelchair Mobility    Modified Rankin (Stroke Patients Only)       Balance Overall balance assessment: Needs assistance Sitting-balance support: Bilateral upper extremity supported;Feet unsupported Sitting balance-Leahy Scale: Poor Sitting balance - Comments: pt exhibits posterior lean and requires bilateral use of UEs and pt assist to prevent pt from falling back into bed Postural control: Posterior lean Standing balance support: Bilateral upper extremity supported Standing balance-Leahy Scale: Poor Standing balance comment: pt requires bilateral UE support and PT cueing to remian upright                             Pertinent Vitals/Pain Pain Assessment: No/denies pain    Home Living Family/patient expects to be discharged to:: Private residence Living Arrangements: Spouse/significant other Available Help at Discharge: Family;Available 24 hours/day Type of Home: House Home Access: Stairs to enter Entrance Stairs-Rails: Right;Left Entrance Stairs-Number of Steps: 3 or 4 Home Layout: Two level;Other (Comment);Able to live on main level with bedroom/bathroom (PLOF pt has been able to acsend/ descend the flight of stairs) Home  Equipment: Gilford Rile - 2 wheels;Walker - 4 wheels;Cane - quad;Wheelchair - Education administrator (comment) (wife reports that pt ambulated in the household using her and the furniture)      Prior Function Level of Independence: Needs assistance  Wife reports the pt has not fallen in the last 6 months  Gait / Transfers Assistance Needed: pt prefer to use assistance from wife to ambulate and uses WC for  community ambulation           Hand Dominance        Extremity/Trunk Assessment                Communication   Communication: Expressive difficulties;Receptive difficulties  Cognition Arousal/Alertness: Lethargic Behavior During Therapy: Flat affect Overall Cognitive Status: Impaired/Different from baseline                                 General Comments: oriented to self      General Comments      Exercises Other Exercises Other Exercises: provided education to the wife on plan of care and recommendations   Assessment/Plan    PT Assessment Patient needs continued PT services  PT Problem List Decreased strength;Decreased mobility;Decreased safety awareness;Decreased activity tolerance;Decreased cognition;Decreased balance       PT Treatment Interventions DME instruction;Therapeutic exercise;Gait training;Balance training;Stair training;Functional mobility training;Therapeutic activities;Patient/family education    PT Goals (Current goals can be found in the Care Plan section)  Acute Rehab PT Goals Patient Stated Goal: to go home PT Goal Formulation: With patient/family Time For Goal Achievement: 10/20/19 Potential to Achieve Goals: Fair    Frequency Min 2X/week   Barriers to discharge        Co-evaluation               AM-PAC PT "6 Clicks" Mobility  Outcome Measure Help needed turning from your back to your side while in a flat bed without using bedrails?: A Little Help needed moving from lying on your back to sitting on the side of a flat bed without using bedrails?: A Little Help needed moving to and from a bed to a chair (including a wheelchair)?: A Lot Help needed standing up from a chair using your arms (e.g., wheelchair or bedside chair)?: A Lot Help needed to walk in hospital room?: A Lot Help needed climbing 3-5 steps with a railing? : Total 6 Click Score: 13    End of Session Equipment Utilized During Treatment:  Gait belt Activity Tolerance: Patient limited by lethargy;Patient limited by fatigue Patient left: in bed;with bed alarm set;with family/visitor present;with call bell/phone within reach Nurse Communication: Mobility status PT Visit Diagnosis: Difficulty in walking, not elsewhere classified (R26.2);Unsteadiness on feet (R26.81);Muscle weakness (generalized) (M62.81)    Time: 2671-2458 PT Time Calculation (min) (ACUTE ONLY): 34 min   Charges:             Hervey Ard, SPT 10/07/19. 4:28 PM

## 2019-10-08 DIAGNOSIS — F0150 Vascular dementia without behavioral disturbance: Secondary | ICD-10-CM

## 2019-10-08 LAB — CBC WITH DIFFERENTIAL/PLATELET
Abs Immature Granulocytes: 0.2 10*3/uL — ABNORMAL HIGH (ref 0.00–0.07)
Basophils Absolute: 0 10*3/uL (ref 0.0–0.1)
Basophils Relative: 0 %
Eosinophils Absolute: 0.1 10*3/uL (ref 0.0–0.5)
Eosinophils Relative: 1 %
HCT: 40.7 % (ref 39.0–52.0)
Hemoglobin: 14.1 g/dL (ref 13.0–17.0)
Immature Granulocytes: 2 %
Lymphocytes Relative: 12 %
Lymphs Abs: 1.3 10*3/uL (ref 0.7–4.0)
MCH: 32.2 pg (ref 26.0–34.0)
MCHC: 34.6 g/dL (ref 30.0–36.0)
MCV: 92.9 fL (ref 80.0–100.0)
Monocytes Absolute: 0.7 10*3/uL (ref 0.1–1.0)
Monocytes Relative: 7 %
Neutro Abs: 8.3 10*3/uL — ABNORMAL HIGH (ref 1.7–7.7)
Neutrophils Relative %: 78 %
Platelets: 135 10*3/uL — ABNORMAL LOW (ref 150–400)
RBC: 4.38 MIL/uL (ref 4.22–5.81)
RDW: 14.3 % (ref 11.5–15.5)
WBC: 10.6 10*3/uL — ABNORMAL HIGH (ref 4.0–10.5)
nRBC: 0 % (ref 0.0–0.2)

## 2019-10-08 LAB — BASIC METABOLIC PANEL
Anion gap: 10 (ref 5–15)
BUN: 40 mg/dL — ABNORMAL HIGH (ref 8–23)
CO2: 23 mmol/L (ref 22–32)
Calcium: 9.4 mg/dL (ref 8.9–10.3)
Chloride: 109 mmol/L (ref 98–111)
Creatinine, Ser: 2.23 mg/dL — ABNORMAL HIGH (ref 0.61–1.24)
GFR calc Af Amer: 31 mL/min — ABNORMAL LOW (ref >60)
GFR calc non Af Amer: 27 mL/min — ABNORMAL LOW (ref >60)
Glucose, Bld: 111 mg/dL — ABNORMAL HIGH (ref 70–99)
Potassium: 3.6 mmol/L (ref 3.5–5.1)
Sodium: 142 mmol/L (ref 135–145)

## 2019-10-08 LAB — GLUCOSE, CAPILLARY
Glucose-Capillary: 108 mg/dL — ABNORMAL HIGH (ref 70–99)
Glucose-Capillary: 156 mg/dL — ABNORMAL HIGH (ref 70–99)
Glucose-Capillary: 124 mg/dL — ABNORMAL HIGH (ref 70–99)
Glucose-Capillary: 142 mg/dL — ABNORMAL HIGH (ref 70–99)

## 2019-10-08 LAB — PROCALCITONIN: Procalcitonin: 1.3 ng/mL

## 2019-10-08 LAB — MAGNESIUM: Magnesium: 2.3 mg/dL (ref 1.7–2.4)

## 2019-10-08 MED ORDER — CEFDINIR 300 MG PO CAPS: 300.0000 mg | ORAL_CAPSULE | Freq: Every day | ORAL | 0 refills | Status: AC

## 2019-10-08 NOTE — Plan of Care (Signed)
  Problem: Skin Integrity: Goal: Risk for impaired skin integrity will decrease Outcome: Progressing   Problem: Safety: Goal: Ability to remain free from injury will improve Outcome: Progressing   Problem: Education: Goal: Knowledge of General Education information will improve Description: Including pain rating scale, medication(s)/side effects and non-pharmacologic comfort measures Outcome: Progressing

## 2019-10-08 NOTE — Care Management Important Message (Signed)
Important Message  Patient Details  Name: Eduardo Richards MRN: [id number] Date of Birth: 1940/09/27   Medicare Important Message Given:  Yes     Dannette Barbara 10/08/2019, 11:23 AM

## 2019-10-08 NOTE — NC FL2 (Signed)
[place] LEVEL OF CARE SCREENING TOOL     IDENTIFICATION  Patient Name: Eduardo Richards Birthdate: [birth year] Sex: male Admission Date (Current Location): 10/05/2019  [facility] and [place] Number:  Engineering geologist and Address:  [facility], [address], [place], [place] [address]      Provider Number: [id number]  Attending Physician Name and Address:  Sharen Hones, MD  Relative Name and Phone Number:  Raziel Koenigs [phone]    Current Level of Care: Hospital Recommended Level of Care: [place] Prior Approval Number:    Date Approved/Denied:   PASRR Number: [id number]  Discharge Plan: SNF    Current Diagnoses: Patient Active Problem List   Diagnosis Date Noted   Sepsis due to urinary tract infection ([place]) 10/05/2019   Coronary artery disease    Diabetes mellitus without complication (HCC)    Dementia ([place])    Hypertension associated with diabetes ([place])    Hyperlipidemia associated with type 2 diabetes mellitus ([place])    CKD (chronic kidney disease), stage IV (HCC)    Hypokalemia    Persistent atrial fibrillation (HCC)     Orientation RESPIRATION BLADDER Height & Weight     Self  Normal External catheter Weight: 81.5 kg Height:  5\' 9"  (175.3 cm)  BEHAVIORAL SYMPTOMS/MOOD NEUROLOGICAL BOWEL NUTRITION STATUS      Incontinent Diet (Renal Carb Modified with fluid restrictions)  AMBULATORY STATUS COMMUNICATION OF NEEDS Skin   Extensive Assist Verbally Normal                       Personal Care Assistance Level of Assistance  Bathing, Feeding, Dressing Bathing Assistance: Maximum assistance Feeding assistance: Limited assistance Dressing Assistance: Maximum assistance     Functional Limitations Info  Sight, Hearing, Speech Sight Info: Adequate Hearing Info: Adequate Speech Info: Adequate    SPECIAL CARE FACTORS FREQUENCY  PT (By licensed PT), OT (By licensed OT)                     Contractures Contractures Info: Not present    Additional Factors Info  Code Status, Allergies Code Status Info: DNR Allergies Info: Clindamycin, Glipizide, Tamsulosin, Codeine           Current Medications (10/08/2019):  This is the current hospital active medication list Current Facility-Administered Medications  Medication Dose Route Frequency Provider Last Rate Last Admin   acetaminophen (TYLENOL) tablet 650 mg  650 mg Oral Q6H PRN Lenore Cordia, MD       Or   acetaminophen (TYLENOL) suppository 650 mg  650 mg Rectal Q6H PRN Lenore Cordia, MD       apixaban Arne [place]) tablet 2.5 mg  2.5 mg Oral BID Zada Finders R, MD   2.5 mg at 10/08/19 0941   aspirin EC tablet 81 mg  81 mg Oral Daily Lenore Cordia, MD   81 mg at 10/08/19 0938   atorvastatin (LIPITOR) tablet 40 mg  40 mg Oral Daily Zada Finders R, MD   40 mg at 10/08/19 0940   cefTRIAXone (ROCEPHIN) 1 g in sodium chloride 0.9 % 100 mL IVPB  1 g Intravenous Q24H Zada Finders R, MD 200 mL/hr at 10/07/19 2227 1 g at 10/07/19 2227   diltiazem (CARDIZEM CD) 24 hr capsule 180 mg  180 mg Oral Daily Zada Finders R, MD   180 mg at 10/08/19 0941   finasteride (PROSCAR) tablet 5 mg  5 mg Oral  Daily Zada Finders R, MD   5 mg at 10/08/19 5809   insulin aspart (novoLOG) injection 0-9 Units  0-9 Units Subcutaneous TID WC Zada Finders R, MD   2 Units at 10/07/19 1220   memantine (NAMENDA) tablet 5 mg  5 mg Oral BID Zada Finders R, MD   5 mg at 10/08/19 0943   metoprolol succinate (TOPROL-XL) 24 hr tablet 25 mg  25 mg Oral Daily Zada Finders R, MD   25 mg at 10/08/19 0941   metoprolol tartrate (LOPRESSOR) injection 5 mg  5 mg Intravenous Once PRN Lenore Cordia, MD       ondansetron (ZOFRAN) tablet 4 mg  4 mg Oral Q6H PRN Lenore Cordia, MD       Or   ondansetron (ZOFRAN) injection 4 mg  4 mg Intravenous Q6H PRN Zada Finders R, MD       sodium chloride flush (NS) 0.9 % injection 3 mL  3 mL Intravenous  Q12H Lenore Cordia, MD   3 mL at 10/07/19 2113     Discharge Medications: Please see discharge summary for a list of discharge medications.  Relevant Imaging Results:  Relevant Lab Results:   Additional Information SS# [id number]  Shelbie Ammons, RN

## 2019-10-08 NOTE — TOC Initial Note (Signed)
Transition of Care [facility]) - Initial/Assessment Note    Patient Details  Name: Eduardo Richards MRN: [id number] Date of Birth: 1940/08/01  [facility] of [facility]) CM/SW Contact:    Shelbie Ammons, RN Phone Number: 10/08/2019, 10:26 AM  Clinical Narrative:    RNCM met with patient's wife Eduardo Richards at bedside to discuss discharge planning. Eduardo Richards reports that she was expecting to meet with the MD at 10am. Discussed with wife that therapy is recommending he go to SNF at discharge. Wife verbalizes understanding but also frustration due to wedding of close family member that is to take place tomorrow. Discussed process for seeking skilled placement for patient. Wife reports that patient has had to go to [facility] in the past and she would like for him to go back to that facility. RNCM discussed sending out bed requests to some other facilities which wife is agreeable to she would also like the referral to be sent to [facility] [place] reached out to [employer] and left message on voicemail for [facility] liaison with admissions for return call.  RNCM verified PASSR, completed FL-2 and sent bed request to [facility] as well.             Expected Discharge Plan: Skilled Nursing Facility Barriers to Discharge: No Barriers Identified   Patient Goals and CMS Choice Patient states their goals for this hospitalization and ongoing recovery are:: Per wife to get him back home when he is able      Expected Discharge Plan and Services Expected Discharge Plan: [place]   Discharge Planning Services: CM Consult Post Acute Care Choice: Nursing Home Living arrangements for the past 2 months: Single Family Home Expected Discharge Date: 10/08/19                                    Prior Living Arrangements/Services Living arrangements for the past 2 months: Single Family Home Lives with:: Spouse Patient language and need for interpreter reviewed:: Yes Do you feel safe  going back to the place where you live?: Yes      Need for Family Participation in Patient Care: Yes (Comment) Care giver support system in place?: Yes (comment)   Criminal Activity/Legal Involvement Pertinent to Current Situation/Hospitalization: No - Comment as needed  Activities of Daily Living Home Assistive Devices/Equipment: Cane (specify quad or straight) ADL Screening (condition at time of admission) Patient's cognitive ability adequate to safely complete daily activities?: No Is the patient deaf or have difficulty hearing?: Yes Does the patient have difficulty seeing, even when wearing glasses/contacts?: No Does the patient have difficulty concentrating, remembering, or making decisions?: Yes Patient able to express need for assistance with ADLs?: No Does the patient have difficulty dressing or bathing?: Yes Independently performs ADLs?: No Communication: Dependent Dressing (OT): Dependent Does the patient have difficulty walking or climbing stairs?: Yes Weakness of Legs: Both Weakness of Arms/Hands: None  Permission Sought/Granted                  Emotional Assessment Appearance:: Appears stated age     Orientation: : Oriented to Self Alcohol / Substance Use: Not Applicable Psych Involvement: No (comment)  Admission diagnosis:  Atrial fibrillation with rapid ventricular response ([place]) [I48.91] Urinary tract infection, acute [N39.0] Sepsis due to urinary tract infection ([place]) [A41.9, N39.0] Sepsis, due to unspecified organism, unspecified whether acute organ dysfunction present [facility]) [A41.9] Patient  Active Problem List   Diagnosis Date Noted   Sepsis due to urinary tract infection ([place]) 10/05/2019   Coronary artery disease    Diabetes mellitus without complication ([place])    Dementia ([place])    Hypertension associated with diabetes ([place])    Hyperlipidemia associated with type 2 diabetes mellitus ([place])    CKD (chronic kidney disease), stage IV ([place])     Hypokalemia    Persistent atrial fibrillation ([place])    PCP:  [name], MD Pharmacy:   [facility] [place], [place] - [place] [address] [place] [facility] [place] [address] Phone: [phone] Fax: [phone]  [id number] [place] [address] [id number]: [phone]: [phone]    Social Determinants of Health (SDOH) Interventions    Readmission Risk Interventions No flowsheet data found.

## 2019-10-08 NOTE — Discharge Summary (Addendum)
Physician Discharge Summary  Patient ID: Eduardo Richards MRN: [id number] DOB/AGE: 1940-03-19 79 y.o.  Admit date: 10/05/2019 Discharge date: 10/08/2019  Admission Diagnoses:  Discharge Diagnoses:  Principal Problem:   Sepsis due to urinary tract infection ([place]) Active Problems:   Coronary artery disease   Diabetes mellitus without complication ([place])   Dementia ([place])   Hypertension associated with diabetes ([place])   Hyperlipidemia associated with type 2 diabetes mellitus ([place])   CKD (chronic kidney disease), stage IV (HCC)   Hypokalemia   Persistent atrial fibrillation ([place])   Discharged Condition: good  Hospital Course:  Eduardo Richards [facility] a 79 y.o.malewith medical history significant forpersistentatrial fibrillation on Eliquis, CKD stage IV, CAD, history of CVA, T2DM, HTN, HLD, BPH, and mixed Alzheimer's and vascular dementia who presents to the ED for evaluation of confusion, fevers, and A. fib with RVR.  He had a heart rate of 101 in the emergency room. [id number] with atrial fibrillation RVR. Urine test was abnormal. Urine culture and blood cultures are sent out. He is started on Rocephin for urinary tract infection.  9/22.Blood cultures and urine culture still pending. Continue Rocephin. Mental status improved today.  9/23.  Blood culture negative, urine culture positive for E. coli.  Patient still has a fever, not ready for discharge.  Obtain renal ultrasound.  #1.  Sepsis with urinary tract infection. Bladder scan did not show any residual.  Blood culture came back no growth.  Urine culture positive for E. coli susceptible to Rocephin.   Due to persistent fever after treatment,  obtained renal ultrasound, did not show any complication such as abscess, hydronephrosis. Procalcitonin level only mildly elevated at 1.3 (renal disease can raise up to 0.5). At this point, he is medically stable to be discharged.  We will continue cefdinir for 5 days.  2.   Permanent atrial fibrillation with rapid immature response. Heart rate much better.  Eliquis.  3.  Acute metabolic encephalopathy with delirium with baseline dementia. Patient still has confusion, which is baseline, no agitation.  4.  Type 2 diabetes. Continue sliding scale insulin.  5.  Essential hypertension. Continue home medicines.  6.  Chronic kidney disease stage IV.[place]  Renal function stable.  7.  Mild thrombocytopenia.    Secondary to infection Improved.  I was told not to discharge pt, Social worker is looking for SNF based on recommendation from PT/OT.  Consults: None  Significant Diagnostic Studies:  EXAM: RENAL / URINARY TRACT ULTRASOUND COMPLETE  COMPARISON:  Jun 04, 2019  FINDINGS: Right Kidney:  Renal measurements: 9.1 x 4.9 x 4.9 cm = volume: 115 mL. There is no hydronephrosis. Again noted is a small cyst in the lower pole measuring 1.3 cm. There is increased cortical echogenicity.  Left Kidney:  Renal measurements: 10.9 x 5.1 x 4.6 cm = volume: 133 mL. There is no hydronephrosis. Small cysts are noted measuring up to approximately 1 cm. There is increased cortical echogenicity.  Bladder:  The bladder is decompressed and therefore is suboptimally evaluated.  Other:  None.  IMPRESSION: 1. No hydronephrosis. 2. Again noted are findings of probable underlying medical renal disease.   Electronically Signed   By: Constance Holster M.D.   On: 10/07/2019 18:38 Treatments: Rocephin  Discharge Exam: Blood pressure (!) 146/93, pulse 65, temperature 98.3 F (36.8 C), temperature source Oral, resp. rate 15, height 5\' 9"  (1.753 m), weight 81.5 kg, SpO2 96 %. General appearance: alert, cooperative and Oriented to people only Resp: clear to auscultation bilaterally Cardio: regular  rate and rhythm, S1, S2 normal, no murmur, click, rub or gallop GI: soft, non-tender; bowel sounds normal; no masses,  no organomegaly Extremities:  extremities normal, atraumatic, no cyanosis or edema  Disposition: Discharge disposition: 01-Home or Self Care       Discharge Instructions    Diet - low sodium heart healthy   Complete by: As directed    Increase activity slowly   Complete by: As directed      Allergies as of 10/08/2019      Reactions   Clindamycin Rash   Glipizide Other (See Comments)   Reported confusion while taking the medication   Tamsulosin Other (See Comments)   ED   Codeine Nausea And Vomiting      Medication List    TAKE these medications   allopurinol 100 MG tablet Commonly known as: ZYLOPRIM Take 200 mg by mouth daily.   aspirin 81 MG EC tablet Take 81 mg by mouth daily.   atorvastatin 40 MG tablet Commonly known as: LIPITOR Take 40 mg by mouth daily.   buPROPion 150 MG 24 hr tablet Commonly known as: WELLBUTRIN XL Take 150 mg by mouth daily.   busPIRone 5 MG tablet Commonly known as: BUSPAR Take 1 tablet by mouth daily.   cefdinir 300 MG capsule Commonly known as: OMNICEF Take 1 capsule (300 mg total) by mouth daily for 5 days.   Cholecalciferol 50 MCG (2000 UT) Caps Take 2,000 Units by mouth daily.   diltiazem 180 MG 24 hr capsule Commonly known as: CARDIZEM CD Take 180 mg by mouth daily.   Eliquis 2.5 MG Tabs tablet Generic drug: apixaban Take 2.5 mg by mouth 2 (two) times daily.   finasteride 5 MG tablet Commonly known as: PROSCAR Take 1 tablet (5 mg total) by mouth daily.   memantine 5 MG tablet Commonly known as: NAMENDA Take 5 mg by mouth 2 (two) times daily.   metoprolol succinate 25 MG 24 hr tablet Commonly known as: TOPROL-XL Take 25 mg by mouth daily.   Rybelsus 3 MG Tabs Generic drug: Semaglutide Take 3 mg by mouth daily.   torsemide 20 MG tablet Commonly known as: DEMADEX Take 40 mg by mouth daily.       Follow-up Information    Kym Groom Guy Begin, MD Follow up in 1 week(s).   Specialty: Family Medicine Contact information: [place] [address] [phone]               Signed: Sharen Hones 10/08/2019, 8:29 AM

## 2019-10-09 DIAGNOSIS — N390 Urinary tract infection, site not specified: Secondary | ICD-10-CM

## 2019-10-09 DIAGNOSIS — B9620 Unspecified Escherichia coli [E. coli] as the cause of diseases classified elsewhere: Secondary | ICD-10-CM

## 2019-10-09 LAB — GLUCOSE, CAPILLARY
Glucose-Capillary: 110 mg/dL — ABNORMAL HIGH (ref 70–99)
Glucose-Capillary: 191 mg/dL — ABNORMAL HIGH (ref 70–99)
Glucose-Capillary: 94 mg/dL (ref 70–99)
Glucose-Capillary: 154 mg/dL — ABNORMAL HIGH (ref 70–99)

## 2019-10-09 LAB — PROCALCITONIN: Procalcitonin: 3.52 ng/mL

## 2019-10-09 MED ORDER — LACTULOSE 10 GM/15ML PO SOLN
20.0000 g | Freq: Once | ORAL | Status: AC
  Administered 2019-10-09: 20 g via ORAL
  Filled 2019-10-09: qty 30

## 2019-10-09 MED ORDER — SODIUM CHLORIDE 0.9 % IV SOLN
INTRAVENOUS | Status: DC | PRN
  Administered 2019-10-09: 250 mL via INTRAVENOUS

## 2019-10-09 MED ORDER — SODIUM CHLORIDE 0.9 % IV SOLN
2.0000 g | INTRAVENOUS | Status: DC
  Administered 2019-10-09: 2 g via INTRAVENOUS
  Filled 2019-10-09: qty 20
  Filled 2019-10-09: qty 0.67

## 2019-10-09 NOTE — Progress Notes (Signed)
Pt became combative while NT was getting his vital signs. Writer re-attempted to get blood pressure due to it being elevated 155/124, unsuccessful. Will reattempt again.

## 2019-10-09 NOTE — Progress Notes (Signed)
PROGRESS NOTE    Eduardo Richards  ZSW:[id number] DOB: Sep 05, 1940 DOA: 10/05/2019 PCP: [name], MD   Chief complaint.  Generalized weakness.  Brief Narrative:  Eduardo Somerville Bakeris a 79 y.o.malewith medical history significant forpersistentatrial fibrillation on Eliquis, CKD stage IV, CAD, history of CVA, T2DM, HTN, HLD, BPH, and mixed Alzheimer's and vascular dementia who presents to the ED for evaluation of confusion, fevers, and A. fib with RVR.  He had a heart rate of 101 in the emergency room. DU202 with atrial fibrillation RVR. Urine test was abnormal. Urine culture and blood cultures are sent out. He is started on Rocephin for urinary tract infection.  9/22.Blood cultures and urine culture still pending. Continue Rocephin. Mental status improved today.  9/23.Blood culture negative, urine culture positive for E. coli. Patient still has a fever, not ready for discharge. Obtain renal ultrasound.  9/25.  Ultrasound did not show any hydronephrosis or abscess.  Procalcitonin level increased to 3.52.  Increase Rocephin to 2 g daily.   Assessment & Plan:   Principal Problem:   Sepsis due to urinary tract infection ([place]) Active Problems:   Coronary artery disease   Diabetes mellitus without complication ([place])   Dementia ([place])   Hypertension associated with diabetes ([place])   Hyperlipidemia associated with type 2 diabetes mellitus ([place])   CKD (chronic kidney disease), stage IV (HCC)   Hypokalemia   Persistent atrial fibrillation ([place])  #1.  Sepsis secondary to E. coli urinary tract infection. Patient currently is hemodynamically stable.  Ultrasound did not show any hydronephrosis or abscess.  Patient has been afebrile for 2 days, however, procalcitonin level went up to 3.5, indicating infection not adequately controlled.  Increase Rocephin to 2 g twice a day.  2.  Permanent atrial fibrillation with a rapid ventricular response. Heart rate much  better.  Continue anticoagulation with Eliquis.  4.  Acute metabolic encephalopathy with delirium with baseline dementia. Condition improved.  5.  Type 2 diabetes. Continue sliding scale insulin.  6.  Essential hypertension. Continue home medicines.  7.  Chronic kidney disease stage IV. Renal function still stable  8.  Mild thrombocytopenia. Improved.  9.  Constipation. Give a dose of lactulose    DVT prophylaxis: Eliquis Code Status: DNR Family Communication: Wife updated  .   Status is: Inpatient  Remains inpatient appropriate because:Inpatient level of care appropriate due to severity of illness.  Patient UTI still not under control, increased procalcitonin level today, Rocephin has been increased to 2 g daily.   Dispo: The patient is from: Home              Anticipated d/c is to: SNF              Anticipated d/c date is: 2 days              Patient currently is not medically stable to d/c.        I/O last 3 completed shifts: In: 240 [P.O.:240] Out: 1300 [Urine:1300] No intake/output data recorded.     Consultants:   None  Procedures: None  Antimicrobials:Rocephin  Subjective: Patient has some baseline confusion.  No agitation. Constipated, no bowel movement since admission. Not short of breath or cough. No fever chills.  Objective: Vitals:   10/08/19 2002 10/08/19 2318 10/09/19 0500 10/09/19 0800  BP: (!) 150/75 (!) 155/124  (!) 151/66  Pulse: 74 81  78  Resp: 18 20  20   Temp: 98.3 F (36.8 C) 98.6 F (37 C)  97.9 F (36.6 C)  TempSrc: Oral Oral  Axillary  SpO2: 95%     Weight:   80.5 kg   Height:        Intake/Output Summary (Last 24 hours) at 10/09/2019 1115 Last data filed at 10/09/2019 0324 Gross per 24 hour  Intake --  Output 150 ml  Net -150 ml   Filed Weights   10/05/19 2116 10/08/19 0500 10/09/19 0500  Weight: 81 kg 81.5 kg 80.5 kg    Examination:  General exam: Appears calm and comfortable  Respiratory system:  Clear to auscultation. Respiratory effort normal. Cardiovascular system: S1 & S2 heard, RRR. No JVD, murmurs, rubs, gallops or clicks. No pedal edema. Gastrointestinal system: Abdomen is nondistended, soft and nontender. No organomegaly or masses felt. Normal bowel sounds heard. Central nervous system: Alert and oriented x1. No focal neurological deficits. Extremities: Symmetric  Skin: No rashes, lesions or ulcers Psychiatry:  Mood & affect appropriate.     Data Reviewed: I have personally reviewed following labs and imaging studies  CBC: Recent Labs  Lab 10/05/19 2115 10/06/19 0418 10/07/19 0620 10/08/19 0510  WBC 11.2* 10.7* 9.0 10.6*  NEUTROABS 9.7*  --  6.7 8.3*  HGB 13.9 13.3 12.8* 14.1  HCT 41.0 37.0* 37.6* 40.7  MCV 94.7 92.0 95.4 92.9  PLT 123* 112* 116* 161*   Basic Metabolic Panel: Recent Labs  Lab 10/05/19 2115 10/06/19 0418 10/07/19 0620 10/08/19 0510  NA 136 137 138 142  K 3.0* 3.7 3.5 3.6  CL 101 103 106 109  CO2 22 22 24 23   GLUCOSE 161* 129* 109* 111*  BUN 53* 53* 48* 40*  CREATININE 2.87* 2.86* 2.52* 2.23*  CALCIUM 9.1 9.3 9.2 9.4  MG 1.9 2.1 2.2 2.3   GFR: Estimated Creatinine Clearance: 26.9 mL/min (A) (by C-G formula based on SCr of 2.23 mg/dL (H)). Liver Function Tests: Recent Labs  Lab 10/05/19 2115  AST 32  ALT 30  ALKPHOS 109  BILITOT 1.5*  PROT 6.0*  ALBUMIN 2.5*   No results for input(s): LIPASE, AMYLASE in the last 168 hours. No results for input(s): AMMONIA in the last 168 hours. Coagulation Profile: Recent Labs  Lab 10/05/19 2115  INR 1.4*   Cardiac Enzymes: No results for input(s): CKTOTAL, CKMB, CKMBINDEX, TROPONINI in the last 168 hours. BNP (last 3 results) No results for input(s): PROBNP in the last 8760 hours. HbA1C: No results for input(s): HGBA1C in the last 72 hours. CBG: Recent Labs  Lab 10/08/19 0740 10/08/19 1157 10/08/19 1646 10/08/19 2029 10/09/19 0838  GLUCAP 108* 156* 124* 142* 110*   Lipid  Profile: No results for input(s): CHOL, HDL, LDLCALC, TRIG, CHOLHDL, LDLDIRECT in the last 72 hours. Thyroid Function Tests: No results for input(s): TSH, T4TOTAL, FREET4, T3FREE, THYROIDAB in the last 72 hours. Anemia Panel: No results for input(s): VITAMINB12, FOLATE, FERRITIN, TIBC, IRON, RETICCTPCT in the last 72 hours. Sepsis Labs: Recent Labs  Lab 10/05/19 2115 10/05/19 2116 10/08/19 0510 10/09/19 0631  PROCALCITON  --   --  1.30 3.52  LATICACIDVEN 1.1 1.0  --   --     Recent Results (from the past 240 hour(s))  Blood Culture (routine x 2)     Status: None (Preliminary result)   Collection Time: 10/05/19  9:15 PM   Specimen: BLOOD  Result Value Ref Range Status   Specimen Description BLOOD RTHAND  Final   Special Requests   Final    BOTTLES DRAWN AEROBIC AND ANAEROBIC Blood Culture results  may not be optimal due to an inadequate volume of blood received in culture bottles   Culture   Final    NO GROWTH 4 DAYS Performed at [facility], [address] [place], [place] [address]    Report Status PENDING  Incomplete  Blood Culture (routine x 2)     Status: None (Preliminary result)   Collection Time: 10/05/19  9:15 PM   Specimen: BLOOD  Result Value Ref Range Status   Specimen Description BLOOD LFOA  Final   Special Requests   Final    BOTTLES DRAWN AEROBIC AND ANAEROBIC Blood Culture adequate volume   Culture   Final    NO GROWTH 4 DAYS Performed at [facility], [address], [place], [place] [address]    Report Status PENDING  Incomplete  Urine culture     Status: Abnormal   Collection Time: 10/05/19  9:15 PM   Specimen: In/Out Cath Urine  Result Value Ref Range Status   Specimen Description   Final    IN/OUT CATH URINE Performed at [facility], [address] [place], [place] [address]    Special Requests   Final    NONE Performed at [facility], [address] [place], [place] [address]    Culture >=100,000  COLONIES/mL ESCHERICHIA COLI (A)  Final   Report Status 10/07/2019 FINAL  Final   Organism ID, Bacteria ESCHERICHIA COLI (A)  Final      Susceptibility   Escherichia coli - MIC*    AMPICILLIN >=32 RESISTANT Resistant     CEFAZOLIN 8 SENSITIVE Sensitive     CEFTRIAXONE <=0.25 SENSITIVE Sensitive     CIPROFLOXACIN >=4 RESISTANT Resistant     GENTAMICIN <=1 SENSITIVE Sensitive     IMIPENEM <=0.25 SENSITIVE Sensitive     NITROFURANTOIN <=16 SENSITIVE Sensitive     TRIMETH/SULFA >=320 RESISTANT Resistant     AMPICILLIN/SULBACTAM >=32 RESISTANT Resistant     PIP/TAZO <=4 SENSITIVE Sensitive     * >=100,000 COLONIES/mL ESCHERICHIA COLI  SARS Coronavirus 2 by RT PCR (hospital order, performed in [place] hospital lab) Nasopharyngeal Nasopharyngeal Swab     Status: None   Collection Time: 10/05/19  9:15 PM   Specimen: Nasopharyngeal Swab  Result Value Ref Range Status   SARS Coronavirus 2 NEGATIVE NEGATIVE Final    Comment: (NOTE) SARS-CoV-2 target nucleic acids are NOT DETECTED.  The SARS-CoV-2 RNA is generally detectable in upper and lower respiratory specimens during the acute phase of infection. The lowest concentration of SARS-CoV-2 viral copies this assay can detect is 250 copies / mL. A negative result does not preclude SARS-CoV-2 infection and should not be used as the sole basis for treatment or other patient management decisions.  A negative result may occur with improper specimen collection / handling, submission of specimen other than nasopharyngeal swab, presence of viral mutation(s) within the areas targeted by this assay, and inadequate number of viral copies (<250 copies / mL). A negative result must be combined with clinical observations, patient history, and epidemiological information.  Fact Sheet for Patients:   StrictlyIdeas.no  Fact Sheet for Healthcare Providers: BankingDealers.co.za  This test is not yet  approved or  cleared by the [place] FDA and has been authorized for detection and/or diagnosis of SARS-CoV-2 by FDA under an Emergency Use Authorization (EUA).  This EUA will remain in effect (meaning this test can be used) for the duration of the COVID-19 declaration under Section 564(b)(1) of the Act, 21  U.S.C. section 360bbb-3(b)(1), unless the authorization is terminated or revoked sooner.  Performed at [facility], [address], [place], [place] [address]          Radiology Studies: US RENAL  Result Date: 10/07/2019 CLINICAL DATA:  Pyelonephritis EXAM: RENAL / URINARY TRACT ULTRASOUND COMPLETE COMPARISON:  Jun 04, 2019 FINDINGS: Right Kidney: Renal measurements: 9.1 x 4.9 x 4.9 cm = volume: 115 mL. There is no hydronephrosis. Again noted is a small cyst in the lower pole measuring 1.3 cm. There is increased cortical echogenicity. Left Kidney: Renal measurements: 10.9 x 5.1 x 4.6 cm = volume: 133 mL. There is no hydronephrosis. Small cysts are noted measuring up to approximately 1 cm. There is increased cortical echogenicity. Bladder: The bladder is decompressed and therefore is suboptimally evaluated. Other: None. IMPRESSION: 1. No hydronephrosis. 2. Again noted are findings of probable underlying medical renal disease. Electronically Signed   By: Constance Holster M.D.   On: 10/07/2019 18:38        Scheduled Meds: . apixaban  2.5 mg Oral BID  . aspirin EC  81 mg Oral Daily  . atorvastatin  40 mg Oral Daily  . diltiazem  180 mg Oral Daily  . finasteride  5 mg Oral Daily  . insulin aspart  0-9 Units Subcutaneous TID WC  . memantine  5 mg Oral BID  . metoprolol succinate  25 mg Oral Daily  . sodium chloride flush  3 mL Intravenous Q12H   Continuous Infusions: . cefTRIAXone (ROCEPHIN)  IV       LOS: 4 days    Time spent: 32 minutes    Sharen Hones, MD [facility]   To contact the attending provider between 7A-7P or the covering provider  during after hours 7P-7A, please log into the web site [email or web address] and access using universal [place] password for that web site. If you do not have the password, please call the hospital operator.  10/09/2019, 11:15 AM

## 2019-10-10 ENCOUNTER — Inpatient Hospital Stay: Payer: Medicare Other

## 2019-10-10 LAB — BASIC METABOLIC PANEL
Anion gap: 9 (ref 5–15)
BUN: 38 mg/dL — ABNORMAL HIGH (ref 8–23)
CO2: 26 mmol/L (ref 22–32)
Calcium: 9.5 mg/dL (ref 8.9–10.3)
Chloride: 110 mmol/L (ref 98–111)
Creatinine, Ser: 2.04 mg/dL — ABNORMAL HIGH (ref 0.61–1.24)
GFR calc Af Amer: 35 mL/min — ABNORMAL LOW (ref >60)
GFR calc non Af Amer: 30 mL/min — ABNORMAL LOW (ref >60)
Glucose, Bld: 131 mg/dL — ABNORMAL HIGH (ref 70–99)
Potassium: 3.8 mmol/L (ref 3.5–5.1)
Sodium: 145 mmol/L (ref 135–145)

## 2019-10-10 LAB — CBC WITH DIFFERENTIAL/PLATELET
Abs Immature Granulocytes: 0.15 10*3/uL — ABNORMAL HIGH (ref 0.00–0.07)
Basophils Absolute: 0 10*3/uL (ref 0.0–0.1)
Basophils Relative: 0 %
Eosinophils Absolute: 0 10*3/uL (ref 0.0–0.5)
Eosinophils Relative: 0 %
HCT: 41.9 % (ref 39.0–52.0)
Hemoglobin: 14.3 g/dL (ref 13.0–17.0)
Immature Granulocytes: 1 %
Lymphocytes Relative: 12 %
Lymphs Abs: 1.5 10*3/uL (ref 0.7–4.0)
MCH: 32 pg (ref 26.0–34.0)
MCHC: 34.1 g/dL (ref 30.0–36.0)
MCV: 93.7 fL (ref 80.0–100.0)
Monocytes Absolute: 0.6 10*3/uL (ref 0.1–1.0)
Monocytes Relative: 5 %
Neutro Abs: 9.7 10*3/uL — ABNORMAL HIGH (ref 1.7–7.7)
Neutrophils Relative %: 82 %
Platelets: 159 10*3/uL (ref 150–400)
RBC: 4.47 MIL/uL (ref 4.22–5.81)
RDW: 14.5 % (ref 11.5–15.5)
WBC: 12 10*3/uL — ABNORMAL HIGH (ref 4.0–10.5)
nRBC: 0 % (ref 0.0–0.2)

## 2019-10-10 LAB — CULTURE, BLOOD (ROUTINE X 2)
Culture: NO GROWTH
Culture: NO GROWTH
Special Requests: ADEQUATE

## 2019-10-10 LAB — GLUCOSE, CAPILLARY
Glucose-Capillary: 129 mg/dL — ABNORMAL HIGH (ref 70–99)
Glucose-Capillary: 106 mg/dL — ABNORMAL HIGH (ref 70–99)
Glucose-Capillary: 152 mg/dL — ABNORMAL HIGH (ref 70–99)
Glucose-Capillary: 146 mg/dL — ABNORMAL HIGH (ref 70–99)

## 2019-10-10 LAB — MAGNESIUM: Magnesium: 2.4 mg/dL (ref 1.7–2.4)

## 2019-10-10 LAB — PROCALCITONIN: Procalcitonin: 4.22 ng/mL

## 2019-10-10 MED ORDER — SODIUM CHLORIDE 0.9 % IV SOLN
1000.0000 mg | Freq: Two times a day (BID) | INTRAVENOUS | Status: DC
  Administered 2019-10-10 – 2019-10-11 (×3): 1000 mg via INTRAVENOUS
  Filled 2019-10-10 (×6): qty 1

## 2019-10-10 NOTE — Progress Notes (Signed)
Pt laying in bed, Scheduled meds given, pt back to sleep. Call bell within reach, RN number on pt's board and bed alarm on

## 2019-10-10 NOTE — Progress Notes (Signed)
PROGRESS NOTE    Eduardo Richards  HYW:[id number] DOB: 12-Nov-1940 DOA: 10/05/2019 PCP: [name], MD   Chief complaint altered mental status. Brief Narrative:  Eduardo Richards a 79 y.o.malewith medical history significant forpersistentatrial fibrillation on Eliquis, CKD stage IV, CAD, history of CVA, T2DM, HTN, HLD, BPH, and mixed Alzheimer's and vascular dementia who presents to the ED for evaluation of confusion, fevers, and A. fib with RVR.  He had a heart rate of 101 in the emergency room. SW546 with atrial fibrillation RVR. Urine test was abnormal. Urine culture and blood cultures are sent out. He is started on Rocephin for urinary tract infection.  9/22.Blood cultures and urine culture still pending. Continue Rocephin. Mental status improved today.  9/23.Blood culture negative, urine culture positive for E. coli. Patient still has a fever, not ready for discharge. Obtain renal ultrasound.  9/25.  Ultrasound did not show any hydronephrosis or abscess.  Procalcitonin level increased to 3.52.  Increase Rocephin to 2 g daily.  9/26.  Procalcitonin level increased to 4.22.  Chest x-ray did not show any pneumonia.  Antibiotic changed to meropenem.   Assessment & Plan:   Principal Problem:   Sepsis due to urinary tract infection ([place]) Active Problems:   Coronary artery disease   Diabetes mellitus without complication ([place])   Dementia ([place])   Hypertension associated with diabetes ([place])   Hyperlipidemia associated with type 2 diabetes mellitus ([place])   CKD (chronic kidney disease), stage IV (HCC)   Hypokalemia   Persistent atrial fibrillation (H[facility])   E. coli UTI  #1.  Sepsis secondary to E. coli urinary tract infection. Patient condition is clinically improved.  Ultrasound did not show any hydronephrosis, urinary retention or abscess. However, patient has progressively worsening procalcitonin level. I have personally reviewed patient chest  x-ray images from today, did not appreciate any pneumonia.  Patient does not have any respite symptoms or aspiration. The source of the elevated procalcitonin level is probably still from urinary tract infection.  As a result, antibiotic changed to meropenem.  Recheck a procalcitonin level tomorrow.  2.  Permanent atrial fibrillation with rapid ventricle response. Continue Eliquis.  3.  Acute metabolic cephalopathy with delirium and baseline dementia. Condition improved.  4.  Type 2 diabetes. Continue current regimen.  5.  Essential hypertension. Continue current treatment.  6.  Chronic kidney disease stage IV. Renal function stable.  7.  Mild thrombocytopenia.  Secondary to infection. Improved.  8.  Constipation. Had a bowel movement after the lactulose.    DVT prophylaxis: Eliquis Code Status: DNR Family Communication: None     Status is: Inpatient  Remains inpatient appropriate because:Inpatient level of care appropriate due to severity of illness, UTI is not uncontrolled, antibiotic changed to IV meropenem.   Dispo: The patient is from: Home              Anticipated d/c is to: SNF              Anticipated d/c date is: 2 days              Patient currently is not medically stable to d/c.        I/O last 3 completed shifts: In: 469.5 [P.O.:360; I.V.:9.5; IV Piggyback:100] Out: 152 [Urine:150; Stool:2] No intake/output data recorded.     Consultants:   None  Procedures: None  Antimicrobials: Meropenem  Subjective: Patient feels well today.  He does not have any cough or shortness of breath.  No chest pain.  No skin infection. No fever or chills. No abdominal pain or nausea vomiting.  Had a bowel movement after stool softener. No dysuria or hematuria.  Objective: Vitals:   10/10/19 0051 10/10/19 0452 10/10/19 0457 10/10/19 0835  BP: (!) 177/105  (!) 165/84 (!) 179/108  Pulse: 75  78 71  Resp: 18  18 16   Temp: 98.2 F (36.8 C)  97.7 F (36.5  C) 98 F (36.7 C)  TempSrc: Oral  Oral Oral  SpO2: 98%  96% 98%  Weight:  81.3 kg    Height:        Intake/Output Summary (Last 24 hours) at 10/10/2019 1109 Last data filed at 10/10/2019 0300 Gross per 24 hour  Intake 469.48 ml  Output 2 ml  Net 467.48 ml   Filed Weights   10/08/19 0500 10/09/19 0500 10/10/19 0452  Weight: 81.5 kg 80.5 kg 81.3 kg    Examination:  General exam: Appears calm and comfortable  Respiratory system: Clear to auscultation. Respiratory effort normal. Cardiovascular system: Irregular.  No JVD, murmurs, rubs, gallops or clicks. No pedal edema. Gastrointestinal system: Abdomen is nondistended, soft and nontender. No organomegaly or masses felt. Normal bowel sounds heard. Central nervous system: Alert and oriented x1. No focal neurological deficits. Extremities: Symmetric  Skin: No rashes, lesions or ulcers Psychiatry: Mood & affect appropriate.     Data Reviewed: I have personally reviewed following labs and imaging studies  CBC: Recent Labs  Lab 10/05/19 2115 10/06/19 0418 10/07/19 0620 10/08/19 0510 10/10/19 0401  WBC 11.2* 10.7* 9.0 10.6* 12.0*  NEUTROABS 9.7*  --  6.7 8.3* 9.7*  HGB 13.9 13.3 12.8* 14.1 14.3  HCT 41.0 37.0* 37.6* 40.7 41.9  MCV 94.7 92.0 95.4 92.9 93.7  PLT 123* 112* 116* 135* 160   Basic Metabolic Panel: Recent Labs  Lab 10/05/19 2115 10/06/19 0418 10/07/19 0620 10/08/19 0510 10/10/19 0401  NA 136 137 138 142 145  K 3.0* 3.7 3.5 3.6 3.8  CL 101 103 106 109 110  CO2 22 22 24 23 26   GLUCOSE 161* 129* 109* 111* 131*  BUN 53* 53* 48* 40* 38*  CREATININE 2.87* 2.86* 2.52* 2.23* 2.04*  CALCIUM 9.1 9.3 9.2 9.4 9.5  MG 1.9 2.1 2.2 2.3 2.4   GFR: Estimated Creatinine Clearance: 29.4 mL/min (A) (by C-G formula based on SCr of 2.04 mg/dL (H)). Liver Function Tests: Recent Labs  Lab 10/05/19 2115  AST 32  ALT 30  ALKPHOS 109  BILITOT 1.5*  PROT 6.0*  ALBUMIN 2.5*   No results for input(s): LIPASE, AMYLASE  in the last 168 hours. No results for input(s): AMMONIA in the last 168 hours. Coagulation Profile: Recent Labs  Lab 10/05/19 2115  INR 1.4*   Cardiac Enzymes: No results for input(s): CKTOTAL, CKMB, CKMBINDEX, TROPONINI in the last 168 hours. BNP (last 3 results) No results for input(s): PROBNP in the last 8760 hours. HbA1C: No results for input(s): HGBA1C in the last 72 hours. CBG: Recent Labs  Lab 10/09/19 0838 10/09/19 1138 10/09/19 1618 10/09/19 2117 10/10/19 0836  GLUCAP 110* 191* 94 154* 129*   Lipid Profile: No results for input(s): CHOL, HDL, LDLCALC, TRIG, CHOLHDL, LDLDIRECT in the last 72 hours. Thyroid Function Tests: No results for input(s): TSH, T4TOTAL, FREET4, T3FREE, THYROIDAB in the last 72 hours. Anemia Panel: No results for input(s): VITAMINB12, FOLATE, FERRITIN, TIBC, IRON, RETICCTPCT in the last 72 hours. Sepsis Labs: Recent Labs  Lab 10/05/19 2115 10/05/19 2116 10/08/19 0510 10/09/19 0631 10/10/19 0401  PROCALCITON  --   --  1.30 3.52 4.22  LATICACIDVEN 1.1 1.0  --   --   --     Recent Results (from the past 240 hour(s))  Blood Culture (routine x 2)     Status: None   Collection Time: 10/05/19  9:15 PM   Specimen: BLOOD  Result Value Ref Range Status   Specimen Description BLOOD RTHAND  Final   Special Requests   Final    BOTTLES DRAWN AEROBIC AND ANAEROBIC Blood Culture results may not be optimal due to an inadequate volume of blood received in culture bottles   Culture   Final    NO GROWTH 5 DAYS Performed at [facility], [address] [place], [place] [address]    Report Status 10/10/2019 FINAL  Final  Blood Culture (routine x 2)     Status: None   Collection Time: 10/05/19  9:15 PM   Specimen: BLOOD  Result Value Ref Range Status   Specimen Description BLOOD LFOA  Final   Special Requests   Final    BOTTLES DRAWN AEROBIC AND ANAEROBIC Blood Culture adequate volume   Culture   Final    NO GROWTH 5 DAYS Performed at  [facility], [address] [place], [place] [address]    Report Status 10/10/2019 FINAL  Final  Urine culture     Status: Abnormal   Collection Time: 10/05/19  9:15 PM   Specimen: In/Out Cath Urine  Result Value Ref Range Status   Specimen Description   Final    IN/OUT CATH URINE Performed at [facility], [address] [place], [place] [address]    Special Requests   Final    NONE Performed at [facility], [address] [place],  [address]    Culture >=100,000 COLONIES/mL ESCHERICHIA COLI (A)  Final   Report Status 10/07/2019 FINAL  Final   Organism ID, Bacteria ESCHERICHIA COLI (A)  Final      Susceptibility   Escherichia coli - MIC*    AMPICILLIN >=32 RESISTANT Resistant     CEFAZOLIN 8 SENSITIVE Sensitive     CEFTRIAXONE <=0.25 SENSITIVE Sensitive     CIPROFLOXACIN >=4 RESISTANT Resistant     GENTAMICIN <=1 SENSITIVE Sensitive     IMIPENEM <=0.25 SENSITIVE Sensitive     NITROFURANTOIN <=16 SENSITIVE Sensitive     TRIMETH/SULFA >=320 RESISTANT Resistant     AMPICILLIN/SULBACTAM >=32 RESISTANT Resistant     PIP/TAZO <=4 SENSITIVE Sensitive     * >=100,000 COLONIES/mL ESCHERICHIA COLI  SARS Coronavirus 2 by RT PCR (hospital order, performed in [place] hospital lab) Nasopharyngeal Nasopharyngeal Swab     Status: None   Collection Time: 10/05/19  9:15 PM   Specimen: Nasopharyngeal Swab  Result Value Ref Range Status   SARS Coronavirus 2 NEGATIVE NEGATIVE Final    Comment: (NOTE) SARS-CoV-2 target nucleic acids are NOT DETECTED.  The SARS-CoV-2 RNA is generally detectable in upper and lower respiratory specimens during the acute phase of infection. The lowest concentration of SARS-CoV-2 viral copies this assay can detect is 250 copies / mL. A negative result does not preclude SARS-CoV-2 infection and should not be used as the sole basis for treatment or other patient management decisions.  A negative result may occur  with improper specimen collection / handling, submission of specimen other than nasopharyngeal swab, presence of viral mutation(s) within the areas targeted by this assay, and inadequate number of viral copies (<250 copies / mL).  A negative result must be combined with clinical observations, patient history, and epidemiological information.  Fact Sheet for Patients:   StrictlyIdeas.no  Fact Sheet for Healthcare Providers: BankingDealers.co.za  This test is not yet approved or  cleared by the [place] FDA and has been authorized for detection and/or diagnosis of SARS-CoV-2 by FDA under an Emergency Use Authorization (EUA).  This EUA will remain in effect (meaning this test can be used) for the duration of the COVID-19 declaration under Section 564(b)(1) of the Act, 21 U.S.C. section 360bbb-3(b)(1), unless the authorization is terminated or revoked sooner.  Performed at [facility], [address] [place], [place] [address]          Radiology Studies: DG Chest 2 View  Result Date: 10/10/2019 CLINICAL DATA:  Sepsis EXAM: CHEST - 2 VIEW COMPARISON:  10/05/2019 FINDINGS: Cardiac shadow is enlarged but stable. The lungs are well aerated bilaterally. Mild bibasilar atelectasis is noted in the posterior lower lobes. No sizable effusion is seen. No bony abnormality is noted. IMPRESSION: Mild bibasilar atelectasis. Electronically Signed   By: Inez Catalina M.D.   On: 10/10/2019 10:43        Scheduled Meds:  apixaban  2.5 mg Oral BID   aspirin EC  81 mg Oral Daily   atorvastatin  40 mg Oral Daily   diltiazem  180 mg Oral Daily   finasteride  5 mg Oral Daily   insulin aspart  0-9 Units Subcutaneous TID WC   memantine  5 mg Oral BID   metoprolol succinate  25 mg Oral Daily   sodium chloride flush  3 mL Intravenous Q12H   Continuous Infusions:  sodium chloride Stopped (10/09/19 2358)   meropenem (MERREM)  IV 1,000 mg (10/10/19 0943)     LOS: 5 days    Time spent: 32 minutes    Sharen Hones, MD [facility]   To contact the attending provider between 7A-7P or the covering provider during after hours 7P-7A, please log into the web site [email or web address] and access using universal [place] password for that web site. If you do not have the password, please call the hospital operator.  10/10/2019, 11:09 AM

## 2019-10-10 NOTE — Progress Notes (Signed)
Per attending MD, pt allowed 3 visitors due to family dynamic as long as wearing proper masks

## 2019-10-10 NOTE — Plan of Care (Signed)
  Problem: Education: Goal: Knowledge of General Education information will improve Description: Including pain rating scale, medication(s)/side effects and non-pharmacologic comfort measures Outcome: Progressing   Problem: Safety: Goal: Ability to remain free from injury will improve Outcome: Progressing   Problem: Skin Integrity: Goal: Risk for impaired skin integrity will decrease Outcome: Progressing   

## 2019-10-10 NOTE — Progress Notes (Deleted)
10/11/2019 9:49 PM   Eduardo Richards 10/06/1940 [id number]  Referring provider: [name], [place],  [place] [address]  No chief complaint on file.   HPI: Eduardo Richards is a 79 year old DNR male with incontinence and high risk hematuria who presents today for yearly follow up.  Incontinence The patient was followed at [facility] and was on Myrbetriq the Oxytrol patch and finasteride.  He had urodynamics in 2011.  He had been on every 3 months maintenance therapy of percutaneous tibial nerve stimulation.  According to medical records he had advancing dementia and has had a stroke.  There was question whether or not Botox could be utilized in the future and perhaps sent to [facility] Risk Hematuria Former smoker.  CT 08/2018 Both adrenal glands appear normal. No urinary tract calculi.  Exophytic renal lesions include a 1.1 cm lesion of the left kidney lower pole, a 1.0 by 1.0 cm left kidney lower pole lesion on image 47/5, a 0.7 cm right mid kidney lesion posteriorly on image 32/2, and a 0.5 cm right kidney upper pole exophytic lesion on image 28/2.  There is some scarring in right kidney upper pole medially.  Enhancement characteristics of the renal lesions are not assessed.  The prostate gland measures 5.6 by 4.5 by 6.4 cm (volume = 84 cm^3) and indents the bladder base.  RUS 05/2019 Bilateral small renal cortical cysts.  Increased renal cortical echotexture consistent with medical renal disease.  RUS 09/2019 No hydronephrosis. Again noted are findings of probable underlying medical renal disease.   PMH: Past Medical History:  Diagnosis Date  . Coronary artery disease   . Dementia ([place])   . Diabetes mellitus without complication ([place])   . Hypertension   . Stroke [facility])     Surgical History: Past Surgical History:  Procedure Laterality Date  . BACK SURGERY    . CARPAL TUNNEL RELEASE Bilateral     Home Medications:  Allergies as of  10/11/2019      Reactions   Clindamycin Rash   Glipizide Other (See Comments)   Reported confusion while taking the medication   Tamsulosin Other (See Comments)   ED   Codeine Nausea And Vomiting      Medication List    Notice   This visit is during an admission. Changes to the med list made in this visit will be reflected in the After Visit Summary of the admission.     Allergies:  Allergies  Allergen Reactions  . Clindamycin Rash  . Glipizide Other (See Comments)    Reported confusion while taking the medication  . Tamsulosin Other (See Comments)    ED  . Codeine Nausea And Vomiting    Family History: Family History  Problem Relation Age of Onset  . Cancer Mother   . Cancer Father     Social History:  reports that he has quit smoking. He has never used smokeless tobacco. He reports current alcohol use. He reports that he does not use drugs.  ROS: For pertinent review of systems please refer to history of present illness  Physical Exam: There were no vitals taken for this visit.  Constitutional:  Well nourished. Alert and oriented, No acute distress. HEENT: [place] AT, moist mucus membranes.  Trachea midline Cardiovascular: No clubbing, cyanosis, or edema. Respiratory: Normal respiratory effort, no increased work of breathing. GI: Abdomen is soft, non tender, non distended, no abdominal masses. Liver and spleen not palpable.  No hernias appreciated.  Stool sample for occult testing is not indicated.   GU: No CVA tenderness.  No bladder fullness or masses.  Patient with circumcised/uncircumcised phallus. ***Foreskin easily retracted***  Urethral meatus is patent.  No penile discharge. No penile lesions or rashes. Scrotum without lesions, cysts, rashes and/or edema.  Testicles are located scrotally bilaterally. No masses are appreciated in the testicles. Left and right epididymis are normal. Rectal: Patient with  normal sphincter tone. Anus and perineum without scarring or  rashes. No rectal masses are appreciated. Prostate is approximately *** grams, *** nodules are appreciated. Seminal vesicles are normal. Skin: No rashes, bruises or suspicious lesions. Lymph: No inguinal adenopathy. Neurologic: Grossly intact, no focal deficits, moving all 4 extremities. Psychiatric: Normal mood and affect.   Laboratory Data: Lab Results  Component Value Date   WBC 12.0 (H) 10/10/2019   HGB 14.3 10/10/2019   HCT 41.9 10/10/2019   MCV 93.7 10/10/2019   PLT 159 10/10/2019    Lab Results  Component Value Date   CREATININE 2.04 (H) 10/10/2019    Urinalysis    Component Value Date/Time   COLORURINE YELLOW (A) 10/05/2019 2115   APPEARANCEUR CLOUDY (A) 10/05/2019 2115   APPEARANCEUR Cloudy (A) 08/17/2018 1320   LABSPEC 1.012 10/05/2019 2115   PHURINE 5.0 10/05/2019 2115   GLUCOSEU NEGATIVE 10/05/2019 2115   HGBUR SMALL (A) 10/05/2019 2115   BILIRUBINUR NEGATIVE 10/05/2019 2115   BILIRUBINUR Negative 08/17/2018 1320   KETONESUR NEGATIVE 10/05/2019 2115   PROTEINUR >=300 (A) 10/05/2019 2115   NITRITE NEGATIVE 10/05/2019 2115   LEUKOCYTESUR LARGE (A) 10/05/2019 2115    Pertinent Imaging: Narrative & Impression  CLINICAL DATA:  Pyelonephritis  EXAM: RENAL / URINARY TRACT ULTRASOUND COMPLETE  COMPARISON:  Jun 04, 2019  FINDINGS: Right Kidney:  Renal measurements: 9.1 x 4.9 x 4.9 cm = volume: 115 mL. There is no hydronephrosis. Again noted is a small cyst in the lower pole measuring 1.3 cm. There is increased cortical echogenicity.  Left Kidney:  Renal measurements: 10.9 x 5.1 x 4.6 cm = volume: 133 mL. There is no hydronephrosis. Small cysts are noted measuring up to approximately 1 cm. There is increased cortical echogenicity.  Bladder:  The bladder is decompressed and therefore is suboptimally evaluated.  Other:  None.  IMPRESSION: 1. No hydronephrosis. 2. Again noted are findings of probable underlying medical  renal disease.   Electronically Signed   By: Constance Holster M.D.   On: 10/07/2019 18:38   I have independently reviewed the films.  See HPI.   Assessment & Plan:   1. Incontinence ***  2. High risk hematuria RUS with bilateral cysts ***  3. Bilateral renal cysts No further surveillance needed      No follow-ups on file.  Zara Council, [facility] [address], [place], [place] [address] [phone]

## 2019-10-11 ENCOUNTER — Ambulatory Visit: Payer: Medicare Other | Admitting: Urology

## 2019-10-11 LAB — GLUCOSE, CAPILLARY
Glucose-Capillary: 107 mg/dL — ABNORMAL HIGH (ref 70–99)
Glucose-Capillary: 163 mg/dL — ABNORMAL HIGH (ref 70–99)
Glucose-Capillary: 94 mg/dL (ref 70–99)
Glucose-Capillary: 119 mg/dL — ABNORMAL HIGH (ref 70–99)

## 2019-10-11 LAB — BASIC METABOLIC PANEL
Anion gap: 7 (ref 5–15)
BUN: 32 mg/dL — ABNORMAL HIGH (ref 8–23)
CO2: 27 mmol/L (ref 22–32)
Calcium: 9.5 mg/dL (ref 8.9–10.3)
Chloride: 110 mmol/L (ref 98–111)
Creatinine, Ser: 2.13 mg/dL — ABNORMAL HIGH (ref 0.61–1.24)
GFR calc Af Amer: 33 mL/min — ABNORMAL LOW (ref >60)
GFR calc non Af Amer: 29 mL/min — ABNORMAL LOW (ref >60)
Glucose, Bld: 121 mg/dL — ABNORMAL HIGH (ref 70–99)
Potassium: 3.7 mmol/L (ref 3.5–5.1)
Sodium: 144 mmol/L (ref 135–145)

## 2019-10-11 LAB — PROCALCITONIN: Procalcitonin: 2.71 ng/mL

## 2019-10-11 MED ORDER — SODIUM CHLORIDE 0.9 % IV SOLN
1.0000 g | INTRAVENOUS | Status: DC
  Administered 2019-10-11: 1 g via INTRAVENOUS
  Filled 2019-10-11 (×3): qty 10

## 2019-10-11 NOTE — Plan of Care (Signed)
  Problem: Safety: Goal: Ability to remain free from injury will improve Outcome: Progressing   Problem: Skin Integrity: Goal: Risk for impaired skin integrity will decrease Outcome: Progressing   

## 2019-10-11 NOTE — Progress Notes (Signed)
PROGRESS NOTE    Eduardo Richards  VHQ:[id number] DOB: May 20, 1940 DOA: 10/05/2019 PCP: [name], MD  Chief complaint.  Generalized weakness.  Brief Narrative:  Eduardo Richards a 79 y.o.malewith medical history significant forpersistentatrial fibrillation on Eliquis, CKD stage IV, CAD, history of CVA, T2DM, HTN, HLD, BPH, and mixed Alzheimer's and vascular dementia who presents to the ED for evaluation of confusion, fevers, and A. fib with RVR.  He had a heart rate of 101 in the emergency room. [id number] with atrial fibrillation RVR. Urine test was abnormal. Urine culture and blood cultures are sent out. He is started on Rocephin for urinary tract infection.  9/22.Blood cultures and urine culture still pending. Continue Rocephin. Mental status improved today.  9/23.Blood culture negative, urine culture positive for E. coli. Patient still has a fever, not ready for discharge. Obtain renal ultrasound.  9/25.Ultrasound did not show any hydronephrosis or abscess. Procalcitonin level increased to 3.52. Increase Rocephin to 2 g daily.  9/26.  Procalcitonin level increased to 4.22.  Chest x-ray did not show any pneumonia.  Antibiotic changed to meropenem.   Assessment & Plan:   Principal Problem:   Sepsis due to urinary tract infection ([place]) Active Problems:   Coronary artery disease   Diabetes mellitus without complication ([place])   Dementia ([place])   Hypertension associated with diabetes ([place])   Hyperlipidemia associated with type 2 diabetes mellitus ([place])   CKD (chronic kidney disease), stage IV (H[facility])   Hypokalemia   Persistent atrial fibrillation (H[facility])   E. coli UTI  #1. Sepsis secondary to E. coli urinary tract infection. Procalcitonin level much improved after giving meropenem.  Continue to monitor for another day.  #2.  Permanent atrial fibrillation with rapid ventricle response. Heart rate under control, continue Eliquis.  3.  Acute  metabolic cephalopathy with delirium and baseline dementia. Condition at baseline.  4.  Type 2 diabetes. Continue current regimen.  5.  Essential hypertension. Continue current treatment.  6.  Chronic kidney disease stage IV. Stable.  7.  Mild thrombocytopenia. Improved.    DVT prophylaxis: Eliquis Code Status: DNR Family Communication: Wife updated.  .   Status is: Inpatient  Remains inpatient appropriate because:Inpatient level of care appropriate due to severity of illness , need another day of meropenem.  Dispo: The patient is from: Home              Anticipated d/c is to: SNF              Anticipated d/c date is: 1 day              Patient currently is not medically stable to d/c.        I/O last 3 completed shifts: In: 429.5 [P.O.:120; I.V.:9.5; IV Piggyback:300] Out: 400 [Urine:400] Total I/O In: 120 [P.O.:120] Out: -      Consultants:   None  Procedures: None  Antimicrobials: Meropenem.  Subjective: Patient is fused as in the baseline.  Denies any short of breath, mild cough nonproductive. No fever chills. No dysuria hematuria. No abdominal pain nausea vomiting.  Objective: Vitals:   10/11/19 0025 10/11/19 0413 10/11/19 0500 10/11/19 0812  BP: (!) 151/84 (!) 157/94  (!) 158/82  Pulse: 96 85  84  Resp: 17 17  16   Temp: 98.3 F (36.8 C) 98.4 F (36.9 C)  97.8 F (36.6 C)  TempSrc: Oral Oral  Oral  SpO2: 97% 90%  92%  Weight:   80.7 kg   Height:  Intake/Output Summary (Last 24 hours) at 10/11/2019 1052 Last data filed at 10/11/2019 1003 Gross per 24 hour  Intake 440 ml  Output 400 ml  Net 40 ml   Filed Weights   10/09/19 0500 10/10/19 0452 10/11/19 0500  Weight: 80.5 kg 81.3 kg 80.7 kg    Examination:  General exam: Appears calm and comfortable  Respiratory system: Clear to auscultation. Respiratory effort normal. Cardiovascular system: Irregular. No JVD, murmurs, rubs, gallops or clicks. No pedal  edema. Gastrointestinal system: Abdomen is nondistended, soft and nontender. No organomegaly or masses felt. Normal bowel sounds heard. Central nervous system: Alert and oriented x1. No focal neurological deficits. Extremities: Symmetric  Skin: No rashes, lesions or ulcers Psychiatry:  Mood & affect appropriate.     Data Reviewed: I have personally reviewed following labs and imaging studies  CBC: Recent Labs  Lab 10/05/19 2115 10/06/19 0418 10/07/19 0620 10/08/19 0510 10/10/19 0401  WBC 11.2* 10.7* 9.0 10.6* 12.0*  NEUTROABS 9.7*  --  6.7 8.3* 9.7*  HGB 13.9 13.3 12.8* 14.1 14.3  HCT 41.0 37.0* 37.6* 40.7 41.9  MCV 94.7 92.0 95.4 92.9 93.7  PLT 123* 112* 116* 135* 354   Basic Metabolic Panel: Recent Labs  Lab 10/05/19 2115 10/05/19 2115 10/06/19 0418 10/07/19 0620 10/08/19 0510 10/10/19 0401 10/11/19 0523  NA 136   < > 137 138 142 145 144  K 3.0*   < > 3.7 3.5 3.6 3.8 3.7  CL 101   < > 103 106 109 110 110  CO2 22   < > 22 24 23 26 27   GLUCOSE 161*   < > 129* 109* 111* 131* 121*  BUN 53*   < > 53* 48* 40* 38* 32*  CREATININE 2.87*   < > 2.86* 2.52* 2.23* 2.04* 2.13*  CALCIUM 9.1   < > 9.3 9.2 9.4 9.5 9.5  MG 1.9  --  2.1 2.2 2.3 2.4  --    < > = values in this interval not displayed.   GFR: Estimated Creatinine Clearance: 28.1 mL/min (A) (by C-G formula based on SCr of 2.13 mg/dL (H)). Liver Function Tests: Recent Labs  Lab 10/05/19 2115  AST 32  ALT 30  ALKPHOS 109  BILITOT 1.5*  PROT 6.0*  ALBUMIN 2.5*   No results for input(s): LIPASE, AMYLASE in the last 168 hours. No results for input(s): AMMONIA in the last 168 hours. Coagulation Profile: Recent Labs  Lab 10/05/19 2115  INR 1.4*   Cardiac Enzymes: No results for input(s): CKTOTAL, CKMB, CKMBINDEX, TROPONINI in the last 168 hours. BNP (last 3 results) No results for input(s): PROBNP in the last 8760 hours. HbA1C: No results for input(s): HGBA1C in the last 72 hours. CBG: Recent Labs   Lab 10/10/19 0836 10/10/19 1149 10/10/19 1622 10/10/19 2106 10/11/19 0813  GLUCAP 129* 106* 152* 146* 107*   Lipid Profile: No results for input(s): CHOL, HDL, LDLCALC, TRIG, CHOLHDL, LDLDIRECT in the last 72 hours. Thyroid Function Tests: No results for input(s): TSH, T4TOTAL, FREET4, T3FREE, THYROIDAB in the last 72 hours. Anemia Panel: No results for input(s): VITAMINB12, FOLATE, FERRITIN, TIBC, IRON, RETICCTPCT in the last 72 hours. Sepsis Labs: Recent Labs  Lab 10/05/19 2115 10/05/19 2116 10/08/19 0510 10/09/19 0631 10/10/19 0401 10/11/19 0523  PROCALCITON  --   --  1.30 3.52 4.22 2.71  LATICACIDVEN 1.1 1.0  --   --   --   --     Recent Results (from the past 240 hour(s))  Blood Culture (routine x 2)     Status: None   Collection Time: 10/05/19  9:15 PM   Specimen: BLOOD  Result Value Ref Range Status   Specimen Description BLOOD RTHAND  Final   Special Requests   Final    BOTTLES DRAWN AEROBIC AND ANAEROBIC Blood Culture results may not be optimal due to an inadequate volume of blood received in culture bottles   Culture   Final    NO GROWTH 5 DAYS Performed at [facility], [address] [place], [place] [address]    Report Status 10/10/2019 FINAL  Final  Blood Culture (routine x 2)     Status: None   Collection Time: 10/05/19  9:15 PM   Specimen: BLOOD  Result Value Ref Range Status   Specimen Description BLOOD LFOA  Final   Special Requests   Final    BOTTLES DRAWN AEROBIC AND ANAEROBIC Blood Culture adequate volume   Culture   Final    NO GROWTH 5 DAYS Performed at [facility], [address] [place], [place] [address]    Report Status 10/10/2019 FINAL  Final  Urine culture     Status: Abnormal   Collection Time: 10/05/19  9:15 PM   Specimen: In/Out Cath Urine  Result Value Ref Range Status   Specimen Description   Final    IN/OUT CATH URINE Performed at [facility], [address], [place], [place] [address]     Special Requests   Final    NONE Performed at [facility], [address] [place], [place] [address]    Culture >=100,000 COLONIES/mL ESCHERICHIA COLI (A)  Final   Report Status 10/07/2019 FINAL  Final   Organism ID, Bacteria ESCHERICHIA COLI (A)  Final      Susceptibility   Escherichia coli - MIC*    AMPICILLIN >=32 RESISTANT Resistant     CEFAZOLIN 8 SENSITIVE Sensitive     CEFTRIAXONE <=0.25 SENSITIVE Sensitive     CIPROFLOXACIN >=4 RESISTANT Resistant     GENTAMICIN <=1 SENSITIVE Sensitive     IMIPENEM <=0.25 SENSITIVE Sensitive     NITROFURANTOIN <=16 SENSITIVE Sensitive     TRIMETH/SULFA >=320 RESISTANT Resistant     AMPICILLIN/SULBACTAM >=32 RESISTANT Resistant     PIP/TAZO <=4 SENSITIVE Sensitive     * >=100,000 COLONIES/mL ESCHERICHIA COLI  SARS Coronavirus 2 by RT PCR (hospital order, performed in [place] hospital lab) Nasopharyngeal Nasopharyngeal Swab     Status: None   Collection Time: 10/05/19  9:15 PM   Specimen: Nasopharyngeal Swab  Result Value Ref Range Status   SARS Coronavirus 2 NEGATIVE NEGATIVE Final    Comment: (NOTE) SARS-CoV-2 target nucleic acids are NOT DETECTED.  The SARS-CoV-2 RNA is generally detectable in upper and lower respiratory specimens during the acute phase of infection. The lowest concentration of SARS-CoV-2 viral copies this assay can detect is 250 copies / mL. A negative result does not preclude SARS-CoV-2 infection and should not be used as the sole basis for treatment or other patient management decisions.  A negative result may occur with improper specimen collection / handling, submission of specimen other than nasopharyngeal swab, presence of viral mutation(s) within the areas targeted by this assay, and inadequate number of viral copies (<250 copies / mL). A negative result must be combined with clinical observations, patient history, and epidemiological information.  Fact Sheet for Patients:    StrictlyIdeas.no  Fact Sheet for Healthcare Providers: BankingDealers.co.za  This test is not  yet approved or  cleared by the [place] and has been authorized for detection and/or diagnosis of SARS-CoV-2 by FDA under an Emergency Use Authorization (EUA).  This EUA will remain in effect (meaning this test can be used) for the duration of the COVID-19 declaration under Section 564(b)(1) of the Act, 21 U.S.C. section 360bbb-3(b)(1), unless the authorization is terminated or revoked sooner.  Performed at [facility], [address] [place], [place] [address]          Radiology Studies: DG Chest 2 View  Result Date: 10/10/2019 CLINICAL DATA:  Sepsis EXAM: CHEST - 2 VIEW COMPARISON:  10/05/2019 FINDINGS: Cardiac shadow is enlarged but stable. The lungs are well aerated bilaterally. Mild bibasilar atelectasis is noted in the posterior lower lobes. No sizable effusion is seen. No bony abnormality is noted. IMPRESSION: Mild bibasilar atelectasis. Electronically Signed   By: Inez Catalina M.D.   On: 10/10/2019 10:43        Scheduled Meds: . apixaban  2.5 mg Oral BID  . aspirin EC  81 mg Oral Daily  . atorvastatin  40 mg Oral Daily  . diltiazem  180 mg Oral Daily  . finasteride  5 mg Oral Daily  . insulin aspart  0-9 Units Subcutaneous TID WC  . memantine  5 mg Oral BID  . metoprolol succinate  25 mg Oral Daily  . sodium chloride flush  3 mL Intravenous Q12H   Continuous Infusions: . sodium chloride Stopped (10/09/19 2358)  . meropenem (MERREM) IV Stopped (10/10/19 2313)     LOS: 6 days    Time spent: 28 minutes    Sharen Hones, MD [facility]   To contact the attending provider between 7A-7P or the covering provider during after hours 7P-7A, please log into the web site [email or web address] and access using universal [place] password for that web site. If you do not have the password, please call  the hospital operator.  10/11/2019, 10:52 AM

## 2019-10-11 NOTE — Care Management Important Message (Signed)
Important Message  Patient Details  Name: Eduardo Richards MRN: [id number] Date of Birth: 03-09-1940   Medicare Important Message Given:  Yes     Dannette Barbara 10/11/2019, 11:21 AM

## 2019-10-11 NOTE — TOC Progression Note (Signed)
Transition of Care [facility]) - Progression Note    Patient Details  Name: Eduardo Richards MRN: [id number] Date of Birth: Apr 06, 1940  [facility] of [facility] CM/SW Contact  Shelbie Ammons, RN Phone Number: 10/11/2019, 2:35 PM  Clinical Narrative:  RNCM met with patient at wife at bedside, patient sleeping. Wife reports that patient has been sleeping off and on throughout the day. Discussed that bed offer has been made from [facility] and wife is glad to hear this and reports she would like to accept the offer. RNCM accepted bed in the hub and reached out to [place] with [facility] to let him know about the bed acceptance.    Expected Discharge Plan: [place] Barriers to Discharge: No Barriers Identified  Expected Discharge Plan and Services Expected Discharge Plan: [place]   Discharge Planning Services: CM Consult Post Acute Care Choice: Nursing Home Living arrangements for the past 2 months: Single Family Home Expected Discharge Date: 10/08/19                                     Social Determinants of Health (SDOH) Interventions    Readmission Risk Interventions No flowsheet data found.

## 2019-10-11 NOTE — Progress Notes (Signed)
Physical Therapy Treatment Patient Details Name: Dodger Sinning MRN: [id number] DOB: Mar 06, 1940 Today's Date: 10/11/2019    History of Present Illness Diem Hasaan Radde is a 79 y.o. male with medical history significant for persistent atrial fibrillation on Eliquis, CKD stage IV, CAD, history of CVA, T2DM, HTN, HLD, BPH, and mixed Alzheimer's and vascular dementia who presents to the ED for evaluation of confusion, fevers, and A. fib with RVR. MD assessment includes sepsis, UTI, rapid articular fiband persisant artial fibrallatrion    PT Comments    Pt in bed, agrees to session and getting up for breakfast. Makes only minimal effort initially for supine ex then stops assisting.  Initiated transition to sitting for transfer but pt not making any effort to assist keeping arms crossed on his chest.  Reminded pt that he was getting up for breakfast "You're making that up."  Pt continued with no assist/effort to get EOB,  Bed pad noted to be wet.  He did roll left with good effort and use of rail but then resisted returning to supine "Oh, just leave it be."  Max a x 1 to get pt to roll back to right to remove wet bedding.  Further mobility deferred.   Follow Up Recommendations  SNF     Equipment Recommendations  None recommended by PT    Recommendations for Other Services       Precautions / Restrictions Precautions Precautions: Fall Restrictions Weight Bearing Restrictions: No    Mobility  Bed Mobility Overal bed mobility: Needs Assistance Bed Mobility: Rolling Rolling: Mod assist;Max assist            Transfers                    Ambulation/Gait                 Stairs             Wheelchair Mobility    Modified Rankin (Stroke Patients Only)       Balance                                            Cognition Arousal/Alertness: Lethargic Behavior During Therapy: Flat affect Overall Cognitive Status: Impaired/Different  from baseline                                 General Comments: oriented to self      Exercises Other Exercises Other Exercises: BLE SLR and heel slides x 10 - minimal effort to assist.    General Comments        Pertinent Vitals/Pain Pain Assessment: No/denies pain    Home Living                      Prior Function            PT Goals (current goals can now be found in the care plan section) Progress towards PT goals: Not progressing toward goals - comment    Frequency    Min 2X/week      PT Plan Current plan remains appropriate    Co-evaluation              AM-PAC PT "6 Clicks" Mobility   Outcome Measure  Help needed turning from your back to your  side while in a flat bed without using bedrails?: A Lot Help needed moving from lying on your back to sitting on the side of a flat bed without using bedrails?: A Lot Help needed moving to and from a bed to a chair (including a wheelchair)?: A Lot Help needed standing up from a chair using your arms (e.g., wheelchair or bedside chair)?: A Lot Help needed to walk in hospital room?: Total Help needed climbing 3-5 steps with a railing? : Total 6 Click Score: 10    End of Session   Activity Tolerance: Other (comment) Patient left: in bed;with bed alarm set;with call bell/phone within reach Nurse Communication: Mobility status       Time: 1747-1595 PT Time Calculation (min) (ACUTE ONLY): 8 min  Charges:  $Therapeutic Exercise: 8-22 mins                    Chesley Noon, PTA 10/11/19, 9:49 AM

## 2019-10-12 LAB — GLUCOSE, CAPILLARY
Glucose-Capillary: 93 mg/dL (ref 70–99)
Glucose-Capillary: 170 mg/dL — ABNORMAL HIGH (ref 70–99)
Glucose-Capillary: 91 mg/dL (ref 70–99)

## 2019-10-12 LAB — RESPIRATORY PANEL BY RT PCR (FLU A&B, COVID)
Influenza A by PCR: NEGATIVE
Influenza B by PCR: NEGATIVE
SARS Coronavirus 2 by RT PCR: NEGATIVE

## 2019-10-12 NOTE — Discharge Summary (Signed)
Physician Discharge Summary  Patient ID: Ziyad Dyar MRN: [id number] DOB/AGE: 05-16-1940 79 y.o.  Admit date: 10/05/2019 Discharge date: 10/12/2019  Admission Diagnoses:  Discharge Diagnoses:  Principal Problem:   Sepsis due to urinary tract infection ([place]) Active Problems:   Coronary artery disease   Diabetes mellitus without complication ([employer])   Dementia ([place])   Hypertension associated with diabetes ([place])   Hyperlipidemia associated with type 2 diabetes mellitus ([place])   CKD (chronic kidney disease), stage IV (HCC)   Hypokalemia   Persistent atrial fibrillation (HCC)   E. coli UTI   Discharged Condition: good  Hospital Course:  [facility] a 79 y.o.malewith medical history significant forpersistentatrial fibrillation on Eliquis, CKD stage IV, CAD, history of CVA, T2DM, HTN, HLD, BPH, and mixed Alzheimer's and vascular dementia who presents to the ED for evaluation of confusion, fevers, and A. fib with RVR.  He had a heart rate of 101 in the emergency room. [id number] with atrial fibrillation RVR. Urine test was abnormal. Urine culture and blood cultures are sent out. He is started on Rocephin for urinary tract infection.  9/22.Blood cultures and urine culture still pending. Continue Rocephin. Mental status improved today.  9/23.Blood culture negative, urine culture positive for E. coli. Patient still has a fever, not ready for discharge. Obtain renal ultrasound.  9/25.Ultrasound did not show any hydronephrosis or abscess. Procalcitonin level increased to 3.52. Increase Rocephin to 2 g daily.  9/26.Procalcitonin level increased to 4.22. Chest x-ray did not show any pneumonia. Antibiotic changed to meropenem.  #1. Sepsis secondary to E. coli urinary tract infection. Condition improved.  Discussed case with Dr. Steva Ready and I infect disease pharmacist, no need for additional meropenem as patient clinically had improved.  Will treat  with cefdinir for a few days.  Procalcitonin level also improved.  #2.  Permanent atrial fibrillation with rapid ventricle response. Heart rate under control, continue Eliquis.  3.  Acute metabolic cephalopathy with delirium and baseline dementia. Condition at baseline.  4.  Type 2 diabetes. Continue current regimen.  5.  Essential hypertension. Continue current treatment.  6.  Chronic kidney disease stage IV. Stable.  7.  Mild thrombocytopenia. Improved.   Consults: None  Significant Diagnostic Studies:    Treatments: Antibiotics with Rocephin, meropenem  Discharge Exam: Blood pressure (!) 153/82, pulse 70, temperature 98.3 F (36.8 C), temperature source Oral, resp. rate 18, height 5\' 9"  (1.753 m), weight 80.7 kg, SpO2 94 %. General appearance: alert, cooperative and Orientated to himself, no agitation. Resp: clear to auscultation bilaterally Cardio: Irregular, heart rate under control.  No murmurs. GI: soft, non-tender; bowel sounds normal; no masses,  no organomegaly Extremities: extremities normal, atraumatic, no cyanosis or edema  Disposition:   Discharge Instructions    Diet - low sodium heart healthy   Complete by: As directed    Increase activity slowly   Complete by: As directed      Allergies as of 10/12/2019      Reactions   Clindamycin Rash   Glipizide Other (See Comments)   Reported confusion while taking the medication   Tamsulosin Other (See Comments)   ED   Codeine Nausea And Vomiting      Medication List    TAKE these medications   allopurinol 100 MG tablet Commonly known as: ZYLOPRIM Take 200 mg by mouth daily.   aspirin 81 MG EC tablet Take 81 mg by mouth daily.   atorvastatin 40 MG tablet Commonly known as: LIPITOR Take 40 mg by  mouth daily.   buPROPion 150 MG 24 hr tablet Commonly known as: WELLBUTRIN XL Take 150 mg by mouth daily.   busPIRone 5 MG tablet Commonly known as: BUSPAR Take 1 tablet by mouth daily.    cefdinir 300 MG capsule Commonly known as: OMNICEF Take 1 capsule (300 mg total) by mouth daily for 5 days.   Cholecalciferol 50 MCG (2000 UT) Caps Take 2,000 Units by mouth daily.   diltiazem 180 MG 24 hr capsule Commonly known as: CARDIZEM CD Take 180 mg by mouth daily.   Eliquis 2.5 MG Tabs tablet Generic drug: apixaban Take 2.5 mg by mouth 2 (two) times daily.   finasteride 5 MG tablet Commonly known as: PROSCAR Take 1 tablet (5 mg total) by mouth daily.   memantine 5 MG tablet Commonly known as: NAMENDA Take 5 mg by mouth 2 (two) times daily.   metoprolol succinate 25 MG 24 hr tablet Commonly known as: TOPROL-XL Take 25 mg by mouth daily.   Rybelsus 3 MG Tabs Generic drug: Semaglutide Take 3 mg by mouth daily.   torsemide 20 MG tablet Commonly known as: DEMADEX Take 40 mg by mouth daily.       Contact information for follow-up providers    Olmedo, Guy Begin, MD Follow up in 1 week(s).   Specialty: Family Medicine Contact information: [place] [address] [phone]            Contact information for after-discharge care    Destination    [facility] Preferred SNF .   Service: Skilled Nursing Contact information: [address] [place] [phone]                  Signed: Sharen Hones 10/12/2019, 9:16 AM

## 2019-10-12 NOTE — Progress Notes (Signed)
Pt d/c to [facility].  D/C packet given to transport to take to nurse at [facility].  Report called and given to [place] at [facility].

## 2019-10-31 ENCOUNTER — Inpatient Hospital Stay
Admission: EM | Admit: 2019-10-31 | Discharge: 2019-11-06 | DRG: 177 | Disposition: A | Discharge status: Hospice | Payer: Medicare Other | Attending: Internal Medicine | Admitting: Internal Medicine

## 2019-10-31 ENCOUNTER — Emergency Department: Payer: Medicare Other

## 2019-10-31 ENCOUNTER — Other Ambulatory Visit: Payer: Self-pay

## 2019-10-31 DIAGNOSIS — E119 Type 2 diabetes mellitus without complications: Secondary | ICD-10-CM | POA: Diagnosis not present

## 2019-10-31 DIAGNOSIS — Z881 Allergy status to other antibiotic agents status: Secondary | ICD-10-CM | POA: Diagnosis not present

## 2019-10-31 DIAGNOSIS — I4819 Other persistent atrial fibrillation: Secondary | ICD-10-CM | POA: Diagnosis present

## 2019-10-31 DIAGNOSIS — Z885 Allergy status to narcotic agent status: Secondary | ICD-10-CM

## 2019-10-31 DIAGNOSIS — Z7982 Long term (current) use of aspirin: Secondary | ICD-10-CM

## 2019-10-31 DIAGNOSIS — E1122 Type 2 diabetes mellitus with diabetic chronic kidney disease: Secondary | ICD-10-CM | POA: Diagnosis present

## 2019-10-31 DIAGNOSIS — Z20822 Contact with and (suspected) exposure to covid-19: Secondary | ICD-10-CM | POA: Diagnosis present

## 2019-10-31 DIAGNOSIS — N184 Chronic kidney disease, stage 4 (severe): Secondary | ICD-10-CM | POA: Diagnosis present

## 2019-10-31 DIAGNOSIS — F32A Depression, unspecified: Secondary | ICD-10-CM | POA: Diagnosis present

## 2019-10-31 DIAGNOSIS — J690 Pneumonitis due to inhalation of food and vomit: Principal | ICD-10-CM | POA: Diagnosis present

## 2019-10-31 DIAGNOSIS — F419 Anxiety disorder, unspecified: Secondary | ICD-10-CM | POA: Diagnosis present

## 2019-10-31 DIAGNOSIS — F0150 Vascular dementia without behavioral disturbance: Secondary | ICD-10-CM | POA: Diagnosis present

## 2019-10-31 DIAGNOSIS — Z8673 Personal history of transient ischemic attack (TIA), and cerebral infarction without residual deficits: Secondary | ICD-10-CM

## 2019-10-31 DIAGNOSIS — Z87891 Personal history of nicotine dependence: Secondary | ICD-10-CM

## 2019-10-31 DIAGNOSIS — J9601 Acute respiratory failure with hypoxia: Secondary | ICD-10-CM | POA: Diagnosis present

## 2019-10-31 DIAGNOSIS — Z888 Allergy status to other drugs, medicaments and biological substances status: Secondary | ICD-10-CM

## 2019-10-31 DIAGNOSIS — Z79899 Other long term (current) drug therapy: Secondary | ICD-10-CM | POA: Diagnosis not present

## 2019-10-31 DIAGNOSIS — E785 Hyperlipidemia, unspecified: Secondary | ICD-10-CM | POA: Diagnosis present

## 2019-10-31 DIAGNOSIS — F0390 Unspecified dementia without behavioral disturbance: Secondary | ICD-10-CM | POA: Diagnosis present

## 2019-10-31 DIAGNOSIS — I2510 Atherosclerotic heart disease of native coronary artery without angina pectoris: Secondary | ICD-10-CM | POA: Diagnosis present

## 2019-10-31 DIAGNOSIS — Z66 Do not resuscitate: Secondary | ICD-10-CM | POA: Diagnosis present

## 2019-10-31 DIAGNOSIS — R627 Adult failure to thrive: Secondary | ICD-10-CM | POA: Diagnosis not present

## 2019-10-31 DIAGNOSIS — G309 Alzheimer's disease, unspecified: Secondary | ICD-10-CM | POA: Diagnosis present

## 2019-10-31 DIAGNOSIS — E1169 Type 2 diabetes mellitus with other specified complication: Secondary | ICD-10-CM | POA: Diagnosis present

## 2019-10-31 DIAGNOSIS — Z7901 Long term (current) use of anticoagulants: Secondary | ICD-10-CM | POA: Diagnosis not present

## 2019-10-31 DIAGNOSIS — F0280 Dementia in other diseases classified elsewhere without behavioral disturbance: Secondary | ICD-10-CM | POA: Diagnosis present

## 2019-10-31 DIAGNOSIS — Z515 Encounter for palliative care: Secondary | ICD-10-CM | POA: Diagnosis not present

## 2019-10-31 DIAGNOSIS — I129 Hypertensive chronic kidney disease with stage 1 through stage 4 chronic kidney disease, or unspecified chronic kidney disease: Secondary | ICD-10-CM | POA: Diagnosis present

## 2019-10-31 DIAGNOSIS — N179 Acute kidney failure, unspecified: Secondary | ICD-10-CM | POA: Diagnosis present

## 2019-10-31 DIAGNOSIS — Z7189 Other specified counseling: Secondary | ICD-10-CM | POA: Diagnosis not present

## 2019-10-31 LAB — RESPIRATORY PANEL BY RT PCR (FLU A&B, COVID)
Influenza A by PCR: NEGATIVE
Influenza B by PCR: NEGATIVE
SARS Coronavirus 2 by RT PCR: NEGATIVE

## 2019-10-31 LAB — CBC WITH DIFFERENTIAL/PLATELET
Abs Immature Granulocytes: 0.05 10*3/uL (ref 0.00–0.07)
Basophils Absolute: 0 10*3/uL (ref 0.0–0.1)
Basophils Relative: 1 %
Eosinophils Absolute: 0.1 10*3/uL (ref 0.0–0.5)
Eosinophils Relative: 1 %
HCT: 50.9 % (ref 39.0–52.0)
Hemoglobin: 16.7 g/dL (ref 13.0–17.0)
Immature Granulocytes: 1 %
Lymphocytes Relative: 23 %
Lymphs Abs: 2 10*3/uL (ref 0.7–4.0)
MCH: 31 pg (ref 26.0–34.0)
MCHC: 32.8 g/dL (ref 30.0–36.0)
MCV: 94.6 fL (ref 80.0–100.0)
Monocytes Absolute: 0.5 10*3/uL (ref 0.1–1.0)
Monocytes Relative: 6 %
Neutro Abs: 5.7 10*3/uL (ref 1.7–7.7)
Neutrophils Relative %: 68 %
Platelets: 178 10*3/uL (ref 150–400)
RBC: 5.38 MIL/uL (ref 4.22–5.81)
RDW: 13.7 % (ref 11.5–15.5)
WBC: 8.5 10*3/uL (ref 4.0–10.5)
nRBC: 0 % (ref 0.0–0.2)

## 2019-10-31 LAB — COMPREHENSIVE METABOLIC PANEL
ALT: 21 U/L (ref 0–44)
AST: 16 U/L (ref 15–41)
Albumin: 3.4 g/dL — ABNORMAL LOW (ref 3.5–5.0)
Alkaline Phosphatase: 112 U/L (ref 38–126)
Anion gap: 9 (ref 5–15)
BUN: 45 mg/dL — ABNORMAL HIGH (ref 8–23)
CO2: 23 mmol/L (ref 22–32)
Calcium: 9.7 mg/dL (ref 8.9–10.3)
Chloride: 112 mmol/L — ABNORMAL HIGH (ref 98–111)
Creatinine, Ser: 2.95 mg/dL — ABNORMAL HIGH (ref 0.61–1.24)
GFR, Estimated: 19 mL/min — ABNORMAL LOW (ref >60)
Glucose, Bld: 153 mg/dL — ABNORMAL HIGH (ref 70–99)
Potassium: 4.1 mmol/L (ref 3.5–5.1)
Sodium: 144 mmol/L (ref 135–145)
Total Bilirubin: 1 mg/dL (ref 0.3–1.2)
Total Protein: 6.7 g/dL (ref 6.5–8.1)

## 2019-10-31 LAB — GLUCOSE, CAPILLARY
Glucose-Capillary: 161 mg/dL — ABNORMAL HIGH (ref 70–99)
Glucose-Capillary: 180 mg/dL — ABNORMAL HIGH (ref 70–99)

## 2019-10-31 LAB — BLOOD GAS, VENOUS

## 2019-10-31 LAB — BRAIN NATRIURETIC PEPTIDE: B Natriuretic Peptide: 141.8 pg/mL — ABNORMAL HIGH (ref 0.0–100.0)

## 2019-10-31 LAB — TROPONIN I (HIGH SENSITIVITY)
Troponin I (High Sensitivity): 10 ng/L (ref <18)
Troponin I (High Sensitivity): 8 ng/L (ref <18)

## 2019-10-31 MED ORDER — DILTIAZEM HCL ER COATED BEADS 180 MG PO CP24
180.0000 mg | ORAL_CAPSULE | Freq: Every day | ORAL | Status: DC
  Administered 2019-11-01: 180 mg via ORAL
  Filled 2019-10-31 (×3): qty 1

## 2019-10-31 MED ORDER — SODIUM CHLORIDE 0.9 % IV SOLN
3.0000 g | Freq: Two times a day (BID) | INTRAVENOUS | Status: AC
  Administered 2019-11-01 – 2019-11-04 (×8): 3 g via INTRAVENOUS
  Filled 2019-10-31 (×3): qty 8
  Filled 2019-10-31: qty 3
  Filled 2019-10-31 (×5): qty 8

## 2019-10-31 MED ORDER — TORSEMIDE 20 MG PO TABS
40.0000 mg | ORAL_TABLET | Freq: Every day | ORAL | Status: DC
  Administered 2019-11-01: 40 mg via ORAL
  Filled 2019-10-31 (×2): qty 2

## 2019-10-31 MED ORDER — SODIUM CHLORIDE 0.9 % IV SOLN
3.0000 g | Freq: Once | INTRAVENOUS | Status: AC
  Administered 2019-10-31: 3 g via INTRAVENOUS
  Filled 2019-10-31: qty 8

## 2019-10-31 MED ORDER — MEMANTINE HCL 5 MG PO TABS
5.0000 mg | ORAL_TABLET | Freq: Two times a day (BID) | ORAL | Status: DC
  Administered 2019-11-01 – 2019-11-06 (×8): 5 mg via ORAL
  Filled 2019-10-31 (×12): qty 1

## 2019-10-31 MED ORDER — SODIUM CHLORIDE 0.9 % IV SOLN: 1.5000 g | Freq: Once | INTRAVENOUS | Status: DC

## 2019-10-31 MED ORDER — SODIUM CHLORIDE 0.9 % IV SOLN: INTRAVENOUS | Status: DC

## 2019-10-31 MED ORDER — VITAMIN D3 25 MCG (1000 UNIT) PO TABS
2000.0000 [IU] | ORAL_TABLET | Freq: Every day | ORAL | Status: DC
  Administered 2019-11-01 – 2019-11-06 (×4): 2000 [IU] via ORAL
  Filled 2019-10-31 (×11): qty 2

## 2019-10-31 MED ORDER — INSULIN ASPART 100 UNIT/ML ~~LOC~~ SOLN
0.0000 [IU] | SUBCUTANEOUS | Status: DC
  Administered 2019-10-31 – 2019-11-01 (×3): 3 [IU] via SUBCUTANEOUS
  Administered 2019-11-01 – 2019-11-02 (×2): 2 [IU] via SUBCUTANEOUS
  Administered 2019-11-02 (×2): 3 [IU] via SUBCUTANEOUS
  Administered 2019-11-02 – 2019-11-03 (×6): 2 [IU] via SUBCUTANEOUS
  Administered 2019-11-03 – 2019-11-04 (×2): 3 [IU] via SUBCUTANEOUS
  Administered 2019-11-04 – 2019-11-05 (×3): 2 [IU] via SUBCUTANEOUS
  Administered 2019-11-05: 3 [IU] via SUBCUTANEOUS
  Administered 2019-11-05 – 2019-11-06 (×3): 2 [IU] via SUBCUTANEOUS
  Filled 2019-10-31 (×22): qty 1

## 2019-10-31 MED ORDER — METOPROLOL SUCCINATE ER 25 MG PO TB24
25.0000 mg | ORAL_TABLET | Freq: Every day | ORAL | Status: DC
  Administered 2019-11-01: 25 mg via ORAL
  Filled 2019-10-31 (×2): qty 1

## 2019-10-31 MED ORDER — ALLOPURINOL 100 MG PO TABS
200.0000 mg | ORAL_TABLET | Freq: Every day | ORAL | Status: DC
  Administered 2019-11-01 – 2019-11-06 (×4): 200 mg via ORAL
  Filled 2019-10-31 (×7): qty 2

## 2019-10-31 MED ORDER — FINASTERIDE 5 MG PO TABS
5.0000 mg | ORAL_TABLET | Freq: Every day | ORAL | Status: DC
  Administered 2019-11-01 – 2019-11-06 (×4): 5 mg via ORAL
  Filled 2019-10-31 (×7): qty 1

## 2019-10-31 MED ORDER — ONDANSETRON HCL 4 MG/2ML IJ SOLN
4.0000 mg | Freq: Once | INTRAMUSCULAR | Status: AC
  Administered 2019-10-31: 4 mg via INTRAVENOUS
  Filled 2019-10-31: qty 2

## 2019-10-31 MED ORDER — ASPIRIN EC 81 MG PO TBEC
81.0000 mg | DELAYED_RELEASE_TABLET | Freq: Every day | ORAL | Status: DC
  Administered 2019-11-01: 81 mg via ORAL
  Filled 2019-10-31 (×2): qty 1

## 2019-10-31 MED ORDER — ATORVASTATIN CALCIUM 20 MG PO TABS
40.0000 mg | ORAL_TABLET | Freq: Every day | ORAL | Status: DC
  Administered 2019-11-01 – 2019-11-06 (×4): 40 mg via ORAL
  Filled 2019-10-31 (×6): qty 2

## 2019-10-31 MED ORDER — BUSPIRONE HCL 10 MG PO TABS
5.0000 mg | ORAL_TABLET | Freq: Every day | ORAL | Status: DC
  Administered 2019-11-01 – 2019-11-06 (×4): 5 mg via ORAL
  Filled 2019-10-31 (×6): qty 1

## 2019-10-31 MED ORDER — APIXABAN 2.5 MG PO TABS
2.5000 mg | ORAL_TABLET | Freq: Two times a day (BID) | ORAL | Status: DC
  Administered 2019-11-01 – 2019-11-06 (×8): 2.5 mg via ORAL
  Filled 2019-10-31 (×13): qty 1

## 2019-10-31 MED ORDER — BUPROPION HCL ER (XL) 150 MG PO TB24
150.0000 mg | ORAL_TABLET | Freq: Every day | ORAL | Status: DC
  Administered 2019-11-01: 150 mg via ORAL
  Filled 2019-10-31 (×2): qty 1

## 2019-10-31 NOTE — Consult Note (Signed)
Pharmacy Antibiotic Note  Eduardo Richards is a 79 y.o. male admitted on 10/31/2019 with aspiration pneumonia .  Pharmacy has been consulted for Unasyn dosing.  Patient received Unasyn 3g x1 at 5872 today.  Plan: Unasyn 3g Q12h - next dose at 0200 tomorrow.  Height: 5\' 9"  (175.3 cm) Weight: 79.4 kg (175 lb) IBW/kg (Calculated) : 70.7  Temp (24hrs), Avg:97.6 F (36.4 C), Min:97.6 F (36.4 C), Max:97.6 F (36.4 C)  Recent Labs  Lab 10/31/19 1142  WBC 8.5  CREATININE 2.95*    Estimated Creatinine Clearance: 20.3 mL/min (A) (by C-G formula based on SCr of 2.95 mg/dL (H)).    Allergies  Allergen Reactions  . Clindamycin Rash  . Glipizide Other (See Comments)    Reported confusion while taking the medication  . Tamsulosin Other (See Comments)    ED  . Codeine Nausea And Vomiting    Antimicrobials this admission: 10/17  Unasyn >>   Dose adjustments this admission:   Microbiology results: BCx:  UCx:  Sputum:  MRSA PCR:   Thank you for allowing pharmacy to be a part of this patient's care.  Rowland Lathe 10/31/2019 4:21 PM

## 2019-10-31 NOTE — ED Provider Notes (Signed)
[facility] Emergency Department Provider Note   ____________________________________________   First MD Initiated Contact with Patient 10/31/19 1134     (approximate)  I have reviewed the triage vital signs and the nursing notes.   HISTORY  Chief Complaint Shortness of Breath (started this AM)    HPI Eduardo Richards is a 80 y.o. male with possible history of dementia, hypertension, hyperlipidemia, diabetes, atrial fibrillation on Eliquis, CKD, CAD, and stroke who presents to the ED for shortness of breath. Per EMS, staff at patient's nursing facility noticed gurgling respirations when they went to check on him this morning. Patient was also more somnolent than usual. On EMS arrival, room air O2 sats were in the low 80s, he was subsequently placed on a nonrebreather with improvement. Patient with difficulty providing history, is only able to nod yes or no, is frequently coughing with significant secretions.        Past Medical History:  Diagnosis Date   Coronary artery disease    Dementia ([place])    Diabetes mellitus without complication ([place])    Hypertension    Stroke [facility])     Patient Active Problem List   Diagnosis Date Noted   Aspiration pneumonia ([place]) 10/31/2019   E. coli UTI 10/09/2019   Sepsis due to urinary tract infection ([place]) 10/05/2019   Coronary artery disease    Diabetes mellitus without complication (HCC)    Dementia (HCC)    Hypertension associated with diabetes ([place])    Hyperlipidemia associated with type 2 diabetes mellitus (HCC)    CKD (chronic kidney disease), stage IV (HCC)    Hypokalemia    Persistent atrial fibrillation (HCC)     Past Surgical History:  Procedure Laterality Date   BACK SURGERY     CARPAL TUNNEL RELEASE Bilateral     Prior to Admission medications   Medication Sig Start Date End Date Taking? Authorizing Provider  allopurinol (ZYLOPRIM) 100 MG tablet Take 200 mg by mouth daily.     [provider]  apixaban (ELIQUIS) 2.5 MG TABS tablet Take 2.5 mg by mouth 2 (two) times daily.  12/21/18   [provider]  aspirin 81 MG EC tablet Take 81 mg by mouth daily.     [provider]  atorvastatin (LIPITOR) 40 MG tablet Take 40 mg by mouth daily.  04/28/12   [provider]  buPROPion (WELLBUTRIN XL) 150 MG 24 hr tablet Take 150 mg by mouth daily.    [provider]  busPIRone (BUSPAR) 5 MG tablet Take 1 tablet by mouth daily. 01/04/19   [provider]  Cholecalciferol 50 MCG (2000 UT) CAPS Take 2,000 Units by mouth daily.    [provider]  diltiazem (CARDIZEM CD) 180 MG 24 hr capsule Take 180 mg by mouth daily.    [provider]  finasteride (PROSCAR) 5 MG tablet Take 1 tablet (5 mg total) by mouth daily. 10/05/18   MacDiarmid, Nicki Reaper, MD  memantine (NAMENDA) 5 MG tablet Take 5 mg by mouth 2 (two) times daily.  08/16/18   [provider]  metoprolol succinate (TOPROL-XL) 25 MG 24 hr tablet Take 25 mg by mouth daily.    [provider]  RYBELSUS 3 MG TABS Take 3 mg by mouth daily.  09/07/19   [provider]  torsemide (DEMADEX) 20 MG tablet Take 40 mg by mouth daily.     [provider]    Allergies Clindamycin, Glipizide, Tamsulosin, and Codeine  Family  History  Problem Relation Age of Onset   Cancer Mother    Cancer Father     Social History Social History   Tobacco Use   Smoking status: Former Smoker   Smokeless tobacco: Never Used  Scientific laboratory technician Use: Never used  Substance Use Topics   Alcohol use: Yes    Comment: occasionally   Drug use: Never    Review of Systems Unable to obtain secondary to dementia and altered mental status.  ____________________________________________   PHYSICAL EXAM:  VITAL SIGNS: ED Triage Vitals  Enc Vitals Group     BP      Pulse      Resp      Temp      Temp src      SpO2      Weight      Height        Head Circumference      Peak Flow      Pain Score      Pain Loc      Pain Edu?      Excl. in [place]?     Constitutional: Somnolent but arousable to voice. Eyes: Conjunctivae are normal. Head: Atraumatic. Nose: No congestion/rhinnorhea. Mouth/Throat: Mucous membranes are moist. Neck: Normal ROM Cardiovascular: Normal rate, regular rhythm. Grossly normal heart sounds. Respiratory: Mild tachypnea and increased respiratory effort.  No retractions. Lungs with crackles throughout. Copious secretions noted with cough. Gastrointestinal: Soft and nontender. No distention. Genitourinary: deferred Musculoskeletal: No lower extremity tenderness nor edema. Neurologic:  Normal speech and language. Global weakness with no gross focal neurologic deficits are appreciated. Skin:  Skin is warm, dry and intact. No rash noted. Psychiatric: Mood and affect are normal. Speech and behavior are normal.  ____________________________________________   LABS (all labs ordered are listed, but only abnormal results are displayed)  Labs Reviewed  COMPREHENSIVE METABOLIC PANEL - Abnormal; Notable for the following components:      Result Value   Chloride 112 (*)    Glucose, Bld 153 (*)    BUN 45 (*)    Creatinine, Ser 2.95 (*)    Albumin 3.4 (*)    GFR, Estimated 19 (*)    All other components within normal limits  BRAIN NATRIURETIC PEPTIDE - Abnormal; Notable for the following components:   B Natriuretic Peptide 141.8 (*)    All other components within normal limits  BLOOD GAS, VENOUS - Abnormal; Notable for the following components:   Bicarbonate 28.2 (*)    All other components within normal limits  RESPIRATORY PANEL BY RT PCR (FLU A&B, COVID)  CBC WITH DIFFERENTIAL/PLATELET  TROPONIN I (HIGH SENSITIVITY)  TROPONIN I (HIGH SENSITIVITY)   ____________________________________________  EKG  ED ECG REPORT I, Blake Divine, the attending physician, personally viewed and interpreted this ECG.    Date: 10/31/2019  EKG Time: 13:09  Rate: 82  Rhythm: atrial fibrillation, rate 82  Axis: Normal  Intervals:none  ST&T Change: None   PROCEDURES  Procedure(s) performed (including Critical Care):  .Critical Care Performed by: Blake Divine, MD Authorized by: Blake Divine, MD   Critical care provider statement:    Critical care time (minutes):  45   Critical care time was exclusive of:  Separately billable procedures and treating other patients and teaching time   Critical care was necessary to treat or prevent imminent or life-threatening deterioration of the following conditions:  Respiratory failure   Critical care was time spent personally by me on the following activities:  Discussions with consultants, evaluation of patient's response to treatment, examination of patient, ordering and performing treatments and interventions, ordering and review of laboratory studies, ordering and review of radiographic studies, pulse oximetry, re-evaluation of patient's condition, obtaining history from patient or surrogate and review of old charts   I assumed direction of critical care for this patient from another provider in my specialty: no       ____________________________________________   INITIAL IMPRESSION / ASSESSMENT AND PLAN / ED COURSE       79 year old male with possible history of dementia, hypertension, hyperlipidemia, diabetes, atrial fibrillation on Eliquis, CKD, CAD, and stroke who presents to the ED for somnolence and gurgling respirations noted by staff at his nursing facility earlier this morning. EMS found patient's room air sat to be in the low 80s and when he was transitioned from EMS stretcher to bed, room air sat again dipped down to 84%. He had significant secretions with coughing, improved with suctioning, and he was placed on 6 L nasal cannula. His O2 sats improved and he became more alert, we were subsequently able to wean supplemental oxygen to 4 L. He does  not have any lower extremity edema but significant crackles noted on lung auscultation. We will check chest x-ray, EKG, labs, and testing for COVID-19. Differential includes aspiration, community-acquired pneumonia, new onset CHF, and viral pneumonia. Prior CODE STATUS noted to be DNR during his most recent admission, which we will confirm with his wife.  O2 sats remained stable on 4 L nasal cannula, wife is now at bedside and confirms patient is back to baseline mental status.  Labs are reassuring aside from mild AKI on top of his chronic kidney disease.  Chest x-ray is unremarkable, but I suspect patient had an aspiration event that is not yet showing on his chest x-ray.  We will cover with Unasyn for now and case discussed with hospitalist for admission.      ____________________________________________   FINAL CLINICAL IMPRESSION(S) / ED DIAGNOSES  Final diagnoses:  Acute respiratory failure with hypoxia (HCC)  Aspiration pneumonia, unspecified aspiration pneumonia type, unspecified laterality, unspecified part of lung [facility])     ED Discharge Orders    None       Note:  This document was prepared using Dragon voice recognition software and may include unintentional dictation errors.   Blake Divine, MD 10/31/19 1313

## 2019-10-31 NOTE — H&P (Addendum)
History and Physical    Eduardo Richards KNL:[id number] DOB: 1940-09-11 DOA: 10/31/2019  PCP: [name], MD   Patient coming from: [place]  I have personally briefly reviewed patient's old medical records in [place]  Chief Complaint: Shortness of breath Most of the history was obtained from the ER notes  HPI: Eduardo Richards is a 79 y.o. 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  Per EMS staff at the patient's nursing home facility he was noted to have gurgling respirations this morning and when they checked on him he appeared to be more somnolent than usual.  At baseline patient is usually alert and oriented to self.  Upon EMS arrival he was noted to have room air pulse oximetry in the low 80s and was subsequently placed on a nonrebreather mask with improvement in his pulse oximetry. Upon arrival to the ER patient was noted to have increased secretions that were suctioned and had a frequent cough.  He is able to nod yes or no to questions. He has been weaned off the nonrebreather mask and is currently on 4 L of oxygen by nasal cannula. I am unable to do review of systems on this patient. Labs show sodium 144, potassium 4.1, chloride 112, bicarb 23, BUN 45, creatinine 2.95, calcium 9.7, alkaline phosphatase 112, albumin 3.4, AST 16, ALT 21, total protein 6.7, BNP 141, troponin  10, white count 8.5, hemoglobin 16.7, hematocrit 50.9, MCV 94.6, RDW 13.7, platelet count 178 Respiratory viral panel is negative Chest x-ray reviewed by me shows mild cardiac enlargement.  No edema or airspace opacity.    ED Course: Patient is a 79 year old nursing home ER for evaluation of shortness of breath and was found to be  hypoxic with room air pulse oximetry in the 80s requiring initially a nonrebreather mask to improve pulse oximetry to the 90s and is now on 4 L of oxygen via nasal cannula.  Patient was noted to have increased secretions that were suctioned in the emergency room. Lung exam revealed crackles mostly in the right lower base and patient was started on Unasyn for presumed aspiration pneumonia.  He will be admitted to the hospital for further evaluation.   Review of Systems: As per HPI otherwise 10 point review of systems negative.   Past Medical History:  Diagnosis Date   Coronary artery disease    Dementia ([place])    Diabetes mellitus without complication ([place])    Hypertension    Stroke [facility])     Past Surgical History:  Procedure Laterality Date   BACK SURGERY     CARPAL TUNNEL RELEASE Bilateral      reports that he has quit smoking. He has never used smokeless tobacco. He reports current alcohol use. He reports that he does not use drugs.  Allergies  Allergen Reactions   Clindamycin Rash   Glipizide Other (See Comments)    Reported confusion while taking the medication   Tamsulosin Other (See Comments)    ED   Codeine Nausea And Vomiting    Family History  Problem Relation Age of Onset   Cancer Mother    Cancer Father      Prior to Admission medications   Medication Sig Start Date End Date Taking? Authorizing  Provider  allopurinol (ZYLOPRIM) 100 MG tablet Take 200 mg by mouth daily.   Yes [provider]  apixaban (ELIQUIS) 2.5 MG TABS tablet Take 2.5 mg by mouth 2 (two) times daily.  12/21/18  Yes [provider]  aspirin 81 MG EC tablet Take 81 mg by mouth daily.    Yes [provider]  atorvastatin (LIPITOR) 40 MG tablet Take 40 mg by mouth daily.  04/28/12  Yes [provider]  buPROPion (WELLBUTRIN XL) 150 MG 24 hr tablet Take 150 mg by mouth daily.   Yes [provider]  busPIRone (BUSPAR) 5 MG tablet Take 1  tablet by mouth daily. 01/04/19  Yes [provider]  Cholecalciferol 50 MCG (2000 UT) CAPS Take 2,000 Units by mouth daily.   Yes [provider]  diltiazem (CARDIZEM CD) 180 MG 24 hr capsule Take 180 mg by mouth daily.   Yes [provider]  finasteride (PROSCAR) 5 MG tablet Take 1 tablet (5 mg total) by mouth daily. 10/05/18  Yes MacDiarmid, Nicki Reaper, MD  memantine (NAMENDA) 10 MG tablet Take 5 mg by mouth 2 (two) times daily.  08/16/18  Yes [provider]  metoprolol succinate (TOPROL-XL) 25 MG 24 hr tablet Take 25 mg by mouth daily.   Yes [provider]  RYBELSUS 3 MG TABS Take 3 mg by mouth daily.  09/07/19  Yes [provider]  torsemide (DEMADEX) 20 MG tablet Take 40 mg by mouth daily.    Yes [provider]    Physical Exam: Vitals:   10/31/19 1146 10/31/19 1156  BP: 134/88 130/78  Pulse:  86  Resp: (!) 26 (!) 23  Temp: 97.6 F (36.4 C)   TempSrc: Oral   SpO2: (!) 84% 100%  Weight:  79.4 kg  Height:  5\' 9"  (1.753 m)     Vitals:   10/31/19 1146 10/31/19 1156  BP: 134/88 130/78  Pulse:  86  Resp: (!) 26 (!) 23  Temp: 97.6 F (36.4 C)   TempSrc: Oral   SpO2: (!) 84% 100%  Weight:  79.4 kg  Height:  5\' 9"  (1.753 m)    Constitutional: NAD, lethargic but arouses to verbal stimuli Eyes: PERRL, lids and conjunctivae normal ENMT: Mucous membranes are moist.  Neck: normal, supple, no masses, no thyromegaly Respiratory: Rhonchi right base, no wheezing, no crackles. Normal respiratory effort. No accessory muscle use.  Cardiovascular: Regular rate and rhythm, no murmurs / rubs / gallops. No extremity edema. 2+ pedal pulses. No carotid bruits.  Abdomen: no tenderness, no masses palpated. No hepatosplenomegaly. Bowel sounds positive.  Musculoskeletal: no clubbing / cyanosis. No joint deformity upper and lower extremities.  Skin: no rashes, lesions, ulcers.  Neurologic: No gross focal neurologic deficit. Psychiatric:  Normal mood and affect.   Labs on Admission: I have personally reviewed following labs and imaging studies  CBC: Recent Labs  Lab 10/31/19 1142  WBC 8.5  NEUTROABS 5.7  HGB 16.7  HCT 50.9  MCV 94.6  PLT 884   Basic Metabolic Panel: Recent Labs  Lab 10/31/19 1142  NA 144  K 4.1  CL 112*  CO2 23  GLUCOSE 153*  BUN 45*  CREATININE 2.95*  CALCIUM 9.7   GFR: Estimated Creatinine Clearance: 20.3 mL/min (A) (by C-G formula based on SCr of 2.95 mg/dL (H)). Liver Function Tests: Recent Labs  Lab 10/31/19 1142  AST 16  ALT 21  ALKPHOS 112  BILITOT 1.0  PROT 6.7  ALBUMIN 3.4*  No results for input(s): LIPASE, AMYLASE in the last 168 hours. No results for input(s): AMMONIA in the last 168 hours. Coagulation Profile: No results for input(s): INR, PROTIME in the last 168 hours. Cardiac Enzymes: No results for input(s): CKTOTAL, CKMB, CKMBINDEX, TROPONINI in the last 168 hours. BNP (last 3 results) No results for input(s): PROBNP in the last 8760 hours. HbA1C: No results for input(s): HGBA1C in the last 72 hours. CBG: No results for input(s): GLUCAP in the last 168 hours. Lipid Profile: No results for input(s): CHOL, HDL, LDLCALC, TRIG, CHOLHDL, LDLDIRECT in the last 72 hours. Thyroid Function Tests: No results for input(s): TSH, T4TOTAL, FREET4, T3FREE, THYROIDAB in the last 72 hours. Anemia Panel: No results for input(s): VITAMINB12, FOLATE, FERRITIN, TIBC, IRON, RETICCTPCT in the last 72 hours. Urine analysis:    Component Value Date/Time   COLORURINE YELLOW (A) 10/05/2019 2115   APPEARANCEUR CLOUDY (A) 10/05/2019 2115   APPEARANCEUR Cloudy (A) 08/17/2018 1320   LABSPEC 1.012 10/05/2019 2115   PHURINE 5.0 10/05/2019 2115   GLUCOSEU NEGATIVE 10/05/2019 2115   HGBUR SMALL (A) 10/05/2019 2115   BILIRUBINUR NEGATIVE 10/05/2019 2115   BILIRUBINUR Negative 08/17/2018 [place] 10/05/2019 2115   PROTEINUR >=300 (A) 10/05/2019 2115   NITRITE  NEGATIVE 10/05/2019 2115   LEUKOCYTESUR LARGE (A) 10/05/2019 2115    Radiological Exams on Admission: DG Chest Portable 1 View  Result Date: 10/31/2019 CLINICAL DATA:  Shortness of breath EXAM: PORTABLE CHEST 1 VIEW COMPARISON:  October 10, 2019 FINDINGS: Lungs are clear. Heart is mildly enlarged with pulmonary vascularity normal. There is aortic atherosclerosis. No adenopathy. No bone lesions. IMPRESSION: Mild cardiac enlargement.  No edema or airspace opacity. Aortic Atherosclerosis (ICD10-I70.0). Electronically Signed   By: Lowella Grip III M.D.   On: 10/31/2019 12:27    EKG: Independently reviewed.   Assessment/Plan Principal Problem:   Aspiration pneumonia (HCC) Active Problems:   Coronary artery disease   Diabetes mellitus without complication (HCC)   Dementia (HCC)   Persistent atrial fibrillation (HCC)   Acute respiratory failure with hypoxia (HCC)     Presumed aspiration pneumonia As evidenced by tachypnea, hypoxia and rhonchi at the right lung base in a patient with increased secretions requiring suctioning Patient with a history of dementia who presents to the emergency room for evaluation of shortness of breath and hypoxia We will request speech therapy consult for swallow function evaluation Start patient on Unasyn adjusted to renal function    Acute respiratory failure with hypoxia Most likely secondary to aspiration pneumonia Patient had room air pulse oximetry in the 80s with associated tachypnea and initially required nonrebreather mask to maintain pulse oximetry.  92% He has been weaned down to 4 L nasal cannula and is able to maintain pulse oximetry greater than 92% We will attempt to wean patient off oxygen as tolerated     Chronic atrial fibrillation Continue diltiazem and metoprolol for rate control Continue Eliquis as secondary prophylaxis for an acute stroke    Dementia Continue BuSpar, Wellbutrin and Namenda    Coronary artery  disease Continue aspirin, statins and metoprolol   Diabetes mellitus Patient will remain n.p.o. until seen and evaluated by speech therapy for swallow function evaluation Sliding scale coverage with point-of-care blood sugar checks every 4 hours   DVT prophylaxis: Apixaban Code Status: DNR Family Communication: Greater than 50% of time was spent discussing patient's condition and plan of care with his wife Navi Erber at the bedside.  All questions and  concerns have been addressed. She verbalizes understanding and agrees with the plan.  CODE STATUS was discussed and he is a DNR  Disposition Plan: Back to previous home environment Consults called: Speech therapy    Afnan Emberton MD [facility]     10/31/2019, 2:33 PM

## 2019-10-31 NOTE — ED Notes (Signed)
Difficulty obtaining lab for trop.  Lab called for stick.

## 2019-11-01 ENCOUNTER — Encounter: Payer: Self-pay | Admitting: Internal Medicine

## 2019-11-01 DIAGNOSIS — Z515 Encounter for palliative care: Secondary | ICD-10-CM | POA: Diagnosis not present

## 2019-11-01 DIAGNOSIS — Z7189 Other specified counseling: Secondary | ICD-10-CM | POA: Diagnosis not present

## 2019-11-01 DIAGNOSIS — J690 Pneumonitis due to inhalation of food and vomit: Secondary | ICD-10-CM | POA: Diagnosis not present

## 2019-11-01 LAB — HEMOGLOBIN A1C
Hgb A1c MFr Bld: 6.4 % — ABNORMAL HIGH (ref 4.8–5.6)
Mean Plasma Glucose: 137 mg/dL

## 2019-11-01 LAB — CBC
HCT: 47.8 % (ref 39.0–52.0)
Hemoglobin: 15.8 g/dL (ref 13.0–17.0)
MCH: 31.6 pg (ref 26.0–34.0)
MCHC: 33.1 g/dL (ref 30.0–36.0)
MCV: 95.6 fL (ref 80.0–100.0)
Platelets: 140 10*3/uL — ABNORMAL LOW (ref 150–400)
RBC: 5 MIL/uL (ref 4.22–5.81)
RDW: 14 % (ref 11.5–15.5)
WBC: 16.8 10*3/uL — ABNORMAL HIGH (ref 4.0–10.5)
nRBC: 0 % (ref 0.0–0.2)

## 2019-11-01 LAB — GLUCOSE, CAPILLARY
Glucose-Capillary: 118 mg/dL — ABNORMAL HIGH (ref 70–99)
Glucose-Capillary: 120 mg/dL — ABNORMAL HIGH (ref 70–99)
Glucose-Capillary: 122 mg/dL — ABNORMAL HIGH (ref 70–99)
Glucose-Capillary: 149 mg/dL — ABNORMAL HIGH (ref 70–99)
Glucose-Capillary: 151 mg/dL — ABNORMAL HIGH (ref 70–99)
Glucose-Capillary: 184 mg/dL — ABNORMAL HIGH (ref 70–99)

## 2019-11-01 LAB — BASIC METABOLIC PANEL
Anion gap: 7 (ref 5–15)
BUN: 53 mg/dL — ABNORMAL HIGH (ref 8–23)
CO2: 25 mmol/L (ref 22–32)
Calcium: 9.5 mg/dL (ref 8.9–10.3)
Chloride: 114 mmol/L — ABNORMAL HIGH (ref 98–111)
Creatinine, Ser: 3.57 mg/dL — ABNORMAL HIGH (ref 0.61–1.24)
GFR, Estimated: 15 mL/min — ABNORMAL LOW (ref >60)
Glucose, Bld: 150 mg/dL — ABNORMAL HIGH (ref 70–99)
Potassium: 4.7 mmol/L (ref 3.5–5.1)
Sodium: 146 mmol/L — ABNORMAL HIGH (ref 135–145)

## 2019-11-01 MED ORDER — SODIUM CHLORIDE 0.45 % IV SOLN: INTRAVENOUS | Status: DC

## 2019-11-01 NOTE — Progress Notes (Signed)
[facility] hospital Liaison note:  Patient is currently open to [facility] community Palliative program. [facility] [place] [place] made aware. Flo Shanks BSN RN, [place] [phone]

## 2019-11-01 NOTE — ED Notes (Signed)
Patient was lifted up in bed by RN and respiratory therapist. Patient appeared more alert, patient opened eyes when called his name, he able to follow commands, Patient kept his right eye closed at times and opened it when asked to open it. Wife said this has been going on for two weeks. When asked patient if that eye hurt, he said yes. No redness, or swelling noted.

## 2019-11-01 NOTE — ED Notes (Signed)
Hourly rounding reveals patient sleeping in room. No complaints, stable, in no acute distress. Patient wife at bedside.

## 2019-11-01 NOTE — Evaluation (Signed)
Clinical/Bedside Swallow Evaluation Patient Details  Name: Eduardo Richards MRN: [id number] Date of Birth: May 03, 1940  Today's Date: 11/01/2019 Time: SLP Start Time (ACUTE ONLY): 7628 SLP Stop Time (ACUTE ONLY): 0915 SLP Time Calculation (min) (ACUTE ONLY): 40 min  Past Medical History:  Past Medical History:  Diagnosis Date  . Coronary artery disease   . Dementia ([place])   . Diabetes mellitus without complication ([place])   . Hypertension   . Stroke [facility])    Past Surgical History:  Past Surgical History:  Procedure Laterality Date  . BACK SURGERY    . CARPAL TUNNEL RELEASE Bilateral    HPI:  Eduardo Richards is a 79 y.o. 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 Pt required oral suction as pt was not able to handle his own secretions. Chest x-ray clear.    Assessment / Plan / Recommendation Clinical Impression  Pt has very advanced cognitive deficits d/t dementia. During this evaluation, pt didn't recognize his wife, unable to follow simple directions with physical, verbal and visual cues, attempting to eat his sheet. Pt is very difficult to re-direct. As such, pt presents with severe oral dysphagia that is c/b oral inattention, orally hold (> 2 minutes) and perseverative chewing. Pt unable to produce volitional swallow. Pt has a cough after he swallows. Suspect premature spillage of bolus d/t oral holding. Pt is at extremely high aspiration risk for aspirating pt's own salvia and POs. His wife stated that he has had oral dysphagia since his previous hospitalization. Given pt's severity level (dysphagia and cognition) along with chronic nature of dysphagia pt's prognosis is poor. Education was provided to pt's wife on aspiration risks  and possible malnutrition. Recommend referral to Palliative Care. Least restrictive diet with known aspiration risks is dysphagia 1 with thin liquids, medicine crushed in puree. ST intervention is not indicated at this time. Recommend oral care QID to reduced bacterial load in salvia.  SLP Visit Diagnosis: Dysphagia, oral phase (R13.11)    Aspiration Risk  Severe aspiration risk;Risk for inadequate nutrition/hydration    Diet Recommendation Thin liquid;Dysphagia 1 (Puree)   Liquid Administration via: Straw Medication Administration: Crushed with puree Supervision:  (pt is a feeder) Compensations: Minimize environmental distractions;Slow rate;Small sips/bites Postural Changes: Seated upright at 90 degrees;Remain upright for at least 30 minutes after po intake    Other  Recommendations Oral Care Recommendations: Oral care QID Other Recommendations: Have oral suction available   Follow up Recommendations  (Palliative Care/ Hospice)      Frequency and Duration            Prognosis Prognosis for Safe Diet Advancement:  (Poor)      Swallow Study   General Date of Onset: 10/31/19 HPI: Eduardo Richards is a 79 y.o. 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 Pt required oral suction as pt was not able to handle his own secretions. Chest x-ray clear.  Type of Study: Bedside Swallow Evaluation Previous Swallow Assessment: none in chart Diet Prior to this Study: NPO Temperature Spikes Noted: No Respiratory Status: Nasal cannula  History of Recent Intubation: No Behavior/Cognition: Confused;Doesn't follow directions;Uncooperative Oral Cavity Assessment: Within Functional Limits Oral Care Completed by SLP: Recent completion by staff Oral  Cavity - Dentition: Adequate natural dentition Self-Feeding Abilities: Total assist (d/t advanced dementia) Patient Positioning: Upright in bed Baseline Vocal Quality: Normal Volitional Cough: Cognitively unable to elicit Volitional Swallow: Unable to elicit    Oral/Motor/Sensory Function Overall Oral Motor/Sensory Function: Within functional limits   Ice Chips Ice chips: Not tested   Thin Liquid Thin Liquid: Within functional limits Presentation: Straw    Nectar Thick Nectar Thick Liquid: Not tested   Honey Thick Honey Thick Liquid: Not tested   Puree Puree: Impaired Presentation: Spoon Oral Phase Impairments: Reduced lingual movement/coordination;Impaired mastication;Poor awareness of bolus Oral Phase Functional Implications: Prolonged oral transit;Oral holding (suspect premature spillage) Pharyngeal Phase Impairments: Cough - Immediate   Solid     Solid: Not tested     Anesha Hackert B. Rutherford Nail, M.S., CCC-SLP, [place] [facility] [place] 11/01/2019,1:30 PM

## 2019-11-01 NOTE — Progress Notes (Addendum)
PROGRESS NOTE    Eduardo Richards  HCW:[id number] DOB: 1940/11/24 DOA: 10/31/2019 PCP: [name], MD    Brief Narrative:  Eduardo Richards is a 79 y.o. 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 He was found gurgling and more somnolent. On arrival 02 at 80's, placed on NRB with improvement.had increased secretions.Weaned down to 4L .Respiratory viral panel is negative    Consultants:     Procedures:   Antimicrobials:   unasyn 10/17    Subjective: Pt quite, looks at me, but does not respond to my questions.   Objective: Vitals:   11/01/19 0800 11/01/19 0830 11/01/19 0950 11/01/19 1330  BP: 131/82 118/65 (!) 143/88 138/78  Pulse: 89 85 79 83  Resp:   17 16  Temp:   98.1 F (36.7 C) 98.2 F (36.8 C)  TempSrc:   Oral Oral  SpO2: 92% 90% 95% 97%  Weight:      Height:        Intake/Output Summary (Last 24 hours) at 11/01/2019 1644 Last data filed at 11/01/2019 1345 Gross per 24 hour  Intake 0 ml  Output 600 ml  Net -600 ml   Filed Weights   10/31/19 1156  Weight: 79.4 kg    Examination:  General exam: Appears calm and comfortable , not interactive with me, quiet Respiratory system: coarse bs, rhonchi R>L, no wheezing Cardiovascular system: S1 & S2 heard, irregular. No JVD, murmurs, rubs, gallops or clicks. Gastrointestinal system: Abdomen is nondistended, soft and nontender.Normal bowel sounds heard. Central nervous system: awake, unable to assess Extremities: no edema Skin: warm, dry Psychiatry:mood appropriate in current setting    Data Reviewed: I have personally reviewed following labs and imaging studies  CBC: Recent Labs  Lab 10/31/19 1142 11/01/19 0418  WBC 8.5 16.8*  NEUTROABS 5.7   --   HGB 16.7 15.8  HCT 50.9 47.8  MCV 94.6 95.6  PLT 178 176*   Basic Metabolic Panel: Recent Labs  Lab 10/31/19 1142 11/01/19 0418  NA 144 146*  K 4.1 4.7  CL 112* 114*  CO2 23 25  GLUCOSE 153* 150*  BUN 45* 53*  CREATININE 2.95* 3.57*  CALCIUM 9.7 9.5   GFR: Estimated Creatinine Clearance: 16.8 mL/min (A) (by C-G formula based on SCr of 3.57 mg/dL (H)). Liver Function Tests: Recent Labs  Lab 10/31/19 1142  AST 16  ALT 21  ALKPHOS 112  BILITOT 1.0  PROT 6.7  ALBUMIN 3.4*   No results for input(s): LIPASE, AMYLASE in the last 168 hours. No results for input(s): AMMONIA in the last 168 hours. Coagulation Profile: No results for input(s): INR, PROTIME in the last 168 hours. Cardiac Enzymes: No results for input(s): CKTOTAL, CKMB, CKMBINDEX, TROPONINI in the last 168 hours. BNP (last 3 results) No results for input(s): PROBNP in the last 8760 hours. HbA1C: No results for input(s): HGBA1C in the last 72 hours. CBG: Recent Labs  Lab 10/31/19 1807 10/31/19 2227 11/01/19 0303 11/01/19 0801 11/01/19 1102  GLUCAP 161* 180* 151* 122* 118*   Lipid Profile: No results for input(s): CHOL, HDL, LDLCALC, TRIG, CHOLHDL, LDLDIRECT in the last 72 hours. Thyroid Function Tests: No results for input(s): TSH, T4TOTAL, FREET4, T3FREE, THYROIDAB in the last 72 hours. Anemia Panel: No results for input(s): VITAMINB12,  FOLATE, FERRITIN, TIBC, IRON, RETICCTPCT in the last 72 hours. Sepsis Labs: No results for input(s): PROCALCITON, LATICACIDVEN in the last 168 hours.  Recent Results (from the past 240 hour(s))  Respiratory Panel by RT PCR (Flu A&B, Covid) - Nasopharyngeal Swab     Status: None   Collection Time: 10/31/19 11:42 AM   Specimen: Nasopharyngeal Swab  Result Value Ref Range Status   SARS Coronavirus 2 by RT PCR NEGATIVE NEGATIVE Final    Comment: (NOTE) SARS-CoV-2 target nucleic acids are NOT DETECTED.  The SARS-CoV-2 RNA is generally detectable in upper  respiratoy specimens during the acute phase of infection. The lowest concentration of SARS-CoV-2 viral copies this assay can detect is 131 copies/mL. A negative result does not preclude SARS-Cov-2 infection and should not be used as the sole basis for treatment or other patient management decisions. A negative result may occur with  improper specimen collection/handling, submission of specimen other than nasopharyngeal swab, presence of viral mutation(s) within the areas targeted by this assay, and inadequate number of viral copies (<131 copies/mL). A negative result must be combined with clinical observations, patient history, and epidemiological information. The expected result is Negative.  Fact Sheet for Patients:  PinkCheek.be  Fact Sheet for Healthcare Providers:  GravelBags.it  This test is no t yet approved or cleared by the [place] FDA and  has been authorized for detection and/or diagnosis of SARS-CoV-2 by FDA under an Emergency Use Authorization (EUA). This EUA will remain  in effect (meaning this test can be used) for the duration of the COVID-19 declaration under Section 564(b)(1) of the Act, 21 U.S.C. section 360bbb-3(b)(1), unless the authorization is terminated or revoked sooner.     Influenza A by PCR NEGATIVE NEGATIVE Final   Influenza B by PCR NEGATIVE NEGATIVE Final    Comment: (NOTE) The Xpert Xpress SARS-CoV-2/FLU/RSV assay is intended as an aid in  the diagnosis of influenza from Nasopharyngeal swab specimens and  should not be used as a sole basis for treatment. Nasal washings and  aspirates are unacceptable for Xpert Xpress SARS-CoV-2/FLU/RSV  testing.  Fact Sheet for Patients: PinkCheek.be  Fact Sheet for Healthcare Providers: GravelBags.it  This test is not yet approved or cleared by the [place] FDA and  has been  authorized for detection and/or diagnosis of SARS-CoV-2 by  FDA under an Emergency Use Authorization (EUA). This EUA will remain  in effect (meaning this test can be used) for the duration of the  Covid-19 declaration under Section 564(b)(1) of the Act, 21  U.S.C. section 360bbb-3(b)(1), unless the authorization is  terminated or revoked. Performed at [facility], [address] [place],  [address]          Radiology Studies: DG Chest Portable 1 View  Result Date: 10/31/2019 CLINICAL DATA:  Shortness of breath EXAM: PORTABLE CHEST 1 VIEW COMPARISON:  October 10, 2019 FINDINGS: Lungs are clear. Heart is mildly enlarged with pulmonary vascularity normal. There is aortic atherosclerosis. No adenopathy. No bone lesions. IMPRESSION: Mild cardiac enlargement.  No edema or airspace opacity. Aortic Atherosclerosis (ICD10-I70.0). Electronically Signed   By: Lowella Grip III M.D.   On: 10/31/2019 12:27        Scheduled Meds:  allopurinol  200 mg Oral Daily   apixaban  2.5 mg Oral BID   aspirin EC  81 mg Oral Daily   atorvastatin  40 mg Oral Daily   buPROPion  150 mg Oral Daily   busPIRone  5 mg Oral Daily  cholecalciferol  2,000 Units Oral Daily   diltiazem  180 mg Oral Daily   finasteride  5 mg Oral Daily   insulin aspart  0-15 Units Subcutaneous Q4H   memantine  5 mg Oral BID   metoprolol succinate  25 mg Oral Daily   Continuous Infusions:  sodium chloride 100 mL/hr at 11/01/19 0414   ampicillin-sulbactam (UNASYN) IV 3 g (11/01/19 1444)    Assessment & Plan:   Principal Problem:   Aspiration pneumonia ([place]) Active Problems:   Coronary artery disease   Diabetes mellitus without complication (HCC)   Dementia (HCC)   Persistent atrial fibrillation (HCC)   Acute respiratory failure with hypoxia (HCC)   Presumed aspiration pneumonia As evidenced by tachypnea, hypoxia and rhonchi at the right lung base in a patient with increased  secretions requiring suctioning SPL saw pt recommended thin liquids dysphagia 1 puree diet. Continue unaysn    Acute respiratory failure with hypoxia Most likely secondary to aspiration pneumonia Patient had room air pulse oximetry in the 80s with associated tachypnea and initially required nonrebreather mask to maintain pulse oximetry.  92% 10/18- weaned to 2L currently, will continue with oxygen support keeping O2 sat above 92%      Chronic atrial fibrillation-rate controlled Continue diltiazem and metoprolol  Continue on Eliquis as secondary prophylaxis for acute stroke   Acute /ckd-baseline around 2.7-2.9 Mildly elevated today with creatinine 3.57 Pt was getting diuretic, will hold Continue ivf Avoid nephrotoxic meds montior levels, if continues to increase will consult nephrology    Dementia Continue BuSpar, Wellbutrin and Namenda  Consult palliative care    Coronary artery disease Continue aspirin, statin, metoprolol     Diabetes mellitus Started on dysphagia 1 diet  Continue      DVT prophylaxis: Eliquis Code Status: DNR Family Communication: updated wife via phone. Per wife pt usually is interactive. She feels he is quite likely b/c he didn't get sleep last night  Status is: Inpatient  Remains inpatient appropriate because:IV treatments appropriate due to intensity of illness or inability to take PO   Dispo: The patient is from: SNF              Anticipated d/c is to: SNF              Anticipated d/c date is: 2 days              Patient currently is not medically stable to d/c.creatinine worsening, palliative consulted, needs iv abx.             LOS: 1 day   Time spent: 45 min with >50% on coc    Nolberto Hanlon, MD [facility] Pager [phone]  If 7PM-7AM, please contact night-coverage [email or web address] Password [email or web address] 11/01/2019, 4:44 PM

## 2019-11-01 NOTE — ED Notes (Signed)
Attempted to call report, nurse will call back.

## 2019-11-01 NOTE — Consult Note (Signed)
Consultation Note Date: 11/01/2019   Patient Name: Eduardo Richards  DOB: 1940/09/10  MRN: [id number]  Age / Sex: 79 y.o., male  PCP: Kym Groom Guy Begin, MD Referring Physician: Nolberto Hanlon, MD  Reason for Consultation: Establishing goals of care  HPI/Patient Profile: [name] a 79 y.o.malewith medical history significant forpersistentatrial fibrillation on Eliquis,diabetes mellitus with complications ofCKD stage IV, CAD, history of CVA,HTN, HLD, BPH, and mixed Alzheimer's and vascular dementia who presents to the ED via EMS for evaluation of shortness of breath.   Clinical Assessment and Goals of Care: Patient is resting in bed, no family in bedside. Upon asking questions, he appears  To be thinking about the question, but is unable to answer. He does say "well thank you" when I expressed I wanted to see how he was doing. He also stated "it's too bad..." and trailed off and did not finish the sentence.   Called to speak with wife. She states they have been married 97 years. They have 2 children. She advises he has been living at home, and has been at a SNF since his admission in September.   She states prior to previous admission, he was able to feed himself and walk with assistance as he has fear of falling; he has a walker. He was continent of bowel. She states at baseline, he sometimes can answer direct questions and will listen to conversations, but usually does not join them, and will only initiate conversation with her. Mrs. Krul advises he has difficulty finishing sentences at baseline.   We discussed his diagnoses, prognosis, GOC, EOL wishes disposition and options.  A detailed discussion was had today regarding advanced directives.  Concepts specific to code status, artifical feeding and hydration, IV antibiotics and rehospitalization were discussed.  The difference between  an aggressive medical intervention path and a comfort care path was discussed.  Values and goals of care important to patient and family were attempted to be elicited.  Discussed limitations of medical interventions to prolong quality of life in some situations and discussed the concept of human mortality.  She states her family is a family of faith, and believes God's will is sovereign. She states she and her husband have advanced directives. She confirms DNR/DNI. She states he would never want dialysis, or a feeding tube. She states dignity is very important to them. Discussed dementia prognosis and sceneries in great depth. She states she will take one day at a time.     SUMMARY OF RECOMMENDATIONS   DNR/DNI. No feeding tube, no dialysis.    Prognosis:   Poor overall.      Primary Diagnoses: Present on Admission: . Aspiration pneumonia ([place]) . Coronary artery disease . Dementia ([place]) . Persistent atrial fibrillation ([place]) . Acute respiratory failure with hypoxia ([place])   I have reviewed the medical record, interviewed the patient and family, and examined the patient. The following aspects are pertinent.  Past Medical History:  Diagnosis Date  . Coronary artery disease   . Dementia ([place])   .  Diabetes mellitus without complication ([place])   . Hypertension   . Stroke [facility])    Social History   Socioeconomic History  . Marital status: Married    Spouse name: Not on file  . Number of children: Not on file  . Years of education: Not on file  . Highest education level: Not on file  Occupational History  . Not on file  Tobacco Use  . Smoking status: Former Research scientist (life sciences)  . Smokeless tobacco: Never Used  Vaping Use  . Vaping Use: Never used  Substance and Sexual Activity  . Alcohol use: Yes    Comment: occasionally  . Drug use: Never  . Sexual activity: Not on file  Other Topics Concern  . Not on file  Social History Narrative  . Not on file   Social Determinants of Health     Financial Resource Strain:   . Difficulty of Paying Living Expenses: Not on file  Food Insecurity:   . Worried About Charity fundraiser in the Last Year: Not on file  . Ran Out of Food in the Last Year: Not on file  Transportation Needs:   . Lack of Transportation (Medical): Not on file  . Lack of Transportation (Non-Medical): Not on file  Physical Activity:   . Days of Exercise per Week: Not on file  . Minutes of Exercise per Session: Not on file  Stress:   . Feeling of Stress : Not on file  Social Connections:   . Frequency of Communication with Friends and Family: Not on file  . Frequency of Social Gatherings with Friends and Family: Not on file  . Attends Religious Services: Not on file  . Active Member of Clubs or Organizations: Not on file  . Attends Archivist Meetings: Not on file  . Marital Status: Not on file   Family History  Problem Relation Age of Onset  . Cancer Mother   . Cancer Father    Scheduled Meds: . allopurinol  200 mg Oral Daily  . apixaban  2.5 mg Oral BID  . aspirin EC  81 mg Oral Daily  . atorvastatin  40 mg Oral Daily  . buPROPion  150 mg Oral Daily  . busPIRone  5 mg Oral Daily  . cholecalciferol  2,000 Units Oral Daily  . diltiazem  180 mg Oral Daily  . finasteride  5 mg Oral Daily  . insulin aspart  0-15 Units Subcutaneous Q4H  . memantine  5 mg Oral BID  . metoprolol succinate  25 mg Oral Daily   Continuous Infusions: . sodium chloride 100 mL/hr at 11/01/19 0414  . ampicillin-sulbactam (UNASYN) IV 3 g (11/01/19 1444)   PRN Meds:. Medications Prior to Admission:  Prior to Admission medications   Medication Sig Start Date End Date Taking? Authorizing Provider  allopurinol (ZYLOPRIM) 100 MG tablet Take 200 mg by mouth daily.   Yes [provider]  apixaban (ELIQUIS) 2.5 MG TABS tablet Take 2.5 mg by mouth 2 (two) times daily.  12/21/18  Yes [provider]  aspirin 81 MG EC tablet Take 81 mg by mouth daily.     Yes [provider]  atorvastatin (LIPITOR) 40 MG tablet Take 40 mg by mouth daily.  04/28/12  Yes [provider]  buPROPion (WELLBUTRIN XL) 150 MG 24 hr tablet Take 150 mg by mouth daily.   Yes [provider]  busPIRone (BUSPAR) 5 MG tablet Take 1 tablet by mouth daily. 01/04/19  Yes [provider]  Cholecalciferol 50 MCG (2000 UT) CAPS Take 2,000 Units by mouth daily.   Yes [provider]  diltiazem (CARDIZEM CD) 180 MG 24 hr capsule Take 180 mg by mouth daily.   Yes [provider]  finasteride (PROSCAR) 5 MG tablet Take 1 tablet (5 mg total) by mouth daily. 10/05/18  Yes MacDiarmid, Nicki Reaper, MD  memantine (NAMENDA) 10 MG tablet Take 5 mg by mouth 2 (two) times daily.  08/16/18  Yes [provider]  metoprolol succinate (TOPROL-XL) 25 MG 24 hr tablet Take 25 mg by mouth daily.   Yes [provider]  RYBELSUS 3 MG TABS Take 3 mg by mouth daily.  09/07/19  Yes [provider]  torsemide (DEMADEX) 20 MG tablet Take 40 mg by mouth daily.    Yes [provider]   Allergies  Allergen Reactions  . Clindamycin Rash  . Glipizide Other (See Comments)    Reported confusion while taking the medication  . Tamsulosin Other (See Comments)    ED  . Codeine Nausea And Vomiting   Review of Systems  Unable to perform ROS Psychiatric/Behavioral: Decreased concentration:     Physical Exam Pulmonary:     Effort: Pulmonary effort is normal.  Neurological:     Mental Status: He is alert.     Vital Signs: BP 138/78 (BP Location: Right Arm)   Pulse 83   Temp 98.2 F (36.8 C) (Oral)   Resp 16   Ht 5\' 9"  (1.753 m)   Wt 79.4 kg   SpO2 97%   BMI 25.84 kg/m  Pain Scale: 0-10   Pain Score: 0-No pain   SpO2: SpO2: 97 % O2 Device:SpO2: 97 % O2 Flow Rate: .O2 Flow Rate (L/min): 2 L/min  IO: Intake/output summary:   Intake/Output Summary (Last 24 hours) at 11/01/2019 1613 Last data filed at 11/01/2019  1345 Gross per 24 hour  Intake 0 ml  Output 600 ml  Net -600 ml    LBM: Last BM Date: 11/01/19 Baseline Weight: Weight: 79.4 kg Most recent weight: Weight: 79.4 kg     Palliative Assessment/Data:     Time In: 3:10 Time Out: 4:40 Time Total: 90 min Greater than 50%  of this time was spent counseling and coordinating care related to the above assessment and plan.  Signed by: Asencion Gowda, NP   Please contact Palliative Medicine Team phone at [phone] for questions and concerns.  For individual provider: See Shea Evans

## 2019-11-01 NOTE — Plan of Care (Signed)
  Problem: Clinical Measurements: Goal: Ability to maintain a body temperature in the normal range will improve Outcome: Progressing   Problem: Respiratory: Goal: Ability to maintain adequate ventilation will improve Outcome: Progressing Goal: Ability to maintain a clear airway will improve Outcome: Progressing   

## 2019-11-02 DIAGNOSIS — E119 Type 2 diabetes mellitus without complications: Secondary | ICD-10-CM | POA: Diagnosis not present

## 2019-11-02 DIAGNOSIS — I2510 Atherosclerotic heart disease of native coronary artery without angina pectoris: Secondary | ICD-10-CM

## 2019-11-02 DIAGNOSIS — J690 Pneumonitis due to inhalation of food and vomit: Secondary | ICD-10-CM | POA: Diagnosis not present

## 2019-11-02 DIAGNOSIS — J9601 Acute respiratory failure with hypoxia: Secondary | ICD-10-CM | POA: Diagnosis not present

## 2019-11-02 LAB — GLUCOSE, CAPILLARY
Glucose-Capillary: 109 mg/dL — ABNORMAL HIGH (ref 70–99)
Glucose-Capillary: 113 mg/dL — ABNORMAL HIGH (ref 70–99)
Glucose-Capillary: 116 mg/dL — ABNORMAL HIGH (ref 70–99)
Glucose-Capillary: 118 mg/dL — ABNORMAL HIGH (ref 70–99)
Glucose-Capillary: 126 mg/dL — ABNORMAL HIGH (ref 70–99)
Glucose-Capillary: 163 mg/dL — ABNORMAL HIGH (ref 70–99)
Glucose-Capillary: 180 mg/dL — ABNORMAL HIGH (ref 70–99)

## 2019-11-02 LAB — CBC
HCT: 42.3 % (ref 39.0–52.0)
Hemoglobin: 13.7 g/dL (ref 13.0–17.0)
MCH: 31.1 pg (ref 26.0–34.0)
MCHC: 32.4 g/dL (ref 30.0–36.0)
MCV: 96.1 fL (ref 80.0–100.0)
Platelets: 137 10*3/uL — ABNORMAL LOW (ref 150–400)
RBC: 4.4 MIL/uL (ref 4.22–5.81)
RDW: 14 % (ref 11.5–15.5)
WBC: 10.5 10*3/uL (ref 4.0–10.5)
nRBC: 0 % (ref 0.0–0.2)

## 2019-11-02 LAB — BASIC METABOLIC PANEL
Anion gap: 11 (ref 5–15)
BUN: 58 mg/dL — ABNORMAL HIGH (ref 8–23)
CO2: 22 mmol/L (ref 22–32)
Calcium: 9.7 mg/dL (ref 8.9–10.3)
Chloride: 114 mmol/L — ABNORMAL HIGH (ref 98–111)
Creatinine, Ser: 3.5 mg/dL — ABNORMAL HIGH (ref 0.61–1.24)
GFR, Estimated: 16 mL/min — ABNORMAL LOW (ref >60)
Glucose, Bld: 119 mg/dL — ABNORMAL HIGH (ref 70–99)
Potassium: 4.2 mmol/L (ref 3.5–5.1)
Sodium: 147 mmol/L — ABNORMAL HIGH (ref 135–145)

## 2019-11-02 LAB — HIV ANTIBODY (ROUTINE TESTING W REFLEX): HIV Screen 4th Generation wRfx: NONREACTIVE

## 2019-11-02 MED ORDER — ASPIRIN 81 MG PO CHEW
81.0000 mg | CHEWABLE_TABLET | Freq: Every day | ORAL | Status: DC
  Administered 2019-11-03 – 2019-11-06 (×3): 81 mg via ORAL
  Filled 2019-11-02 (×4): qty 1

## 2019-11-02 MED ORDER — DILTIAZEM HCL 30 MG PO TABS
60.0000 mg | ORAL_TABLET | Freq: Three times a day (TID) | ORAL | Status: DC
  Administered 2019-11-02 – 2019-11-06 (×9): 60 mg via ORAL
  Filled 2019-11-02 (×13): qty 2

## 2019-11-02 MED ORDER — BUPROPION HCL 75 MG PO TABS
75.0000 mg | ORAL_TABLET | Freq: Two times a day (BID) | ORAL | Status: DC
  Administered 2019-11-02 – 2019-11-06 (×6): 75 mg via ORAL
  Filled 2019-11-02 (×10): qty 1

## 2019-11-02 MED ORDER — METOPROLOL TARTRATE 25 MG PO TABS
12.5000 mg | ORAL_TABLET | Freq: Two times a day (BID) | ORAL | Status: DC
  Administered 2019-11-02 – 2019-11-04 (×4): 12.5 mg via ORAL
  Filled 2019-11-02 (×4): qty 1

## 2019-11-02 NOTE — Progress Notes (Signed)
PROGRESS NOTE    Tynan Boesel  XLK:[id number] DOB: 07-Mar-1940 DOA: 10/31/2019 PCP: [name], MD    Brief Narrative:  Zacary Bauer is a 79 y.o. 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 He was found gurgling and more somnolent. On arrival 02 at 80's, placed on NRB with improvement.had increased secretions.Weaned down to 4L [place].Respiratory viral panel is negative  10/19- more interactive today, responds to some questions. Refusing feeding  Consultants:     Procedures:   Antimicrobials:   unasyn 10/17    Subjective:   Objective: Vitals:   11/01/19 1709 11/01/19 2029 11/01/19 2316 11/02/19 0348  BP: (!) 148/87 (!) 143/81 134/72 (!) 141/76  Pulse: 94 78 84 86  Resp: 18 16 17 17   Temp: 98.4 F (36.9 C) 98.1 F (36.7 C) 98.4 F (36.9 C) 98.2 F (36.8 C)  TempSrc: Axillary Oral    SpO2: 96% 94% 96% 94%  Weight:      Height:        Intake/Output Summary (Last 24 hours) at 11/02/2019 0725 Last data filed at 11/02/2019 0351 Gross per 24 hour  Intake 1694.32 ml  Output 1825 ml  Net -130.68 ml   Filed Weights   10/31/19 1156  Weight: 79.4 kg    Examination: More awake and interactive today, NAD Rhonchorous, no wheezing S1-S2, irregular no murmurs Soft benign nontender positive bowel sounds Awake and interactive No edema Mood and affect appropriate in current setting  Data Reviewed: I have personally reviewed following labs and imaging studies  CBC: Recent Labs  Lab 10/31/19 1142 11/01/19 0418 11/02/19 0340  WBC 8.5 16.8* 10.5  NEUTROABS 5.7  --   --   HGB 16.7 15.8 13.7  HCT 50.9 47.8 42.3  MCV 94.6 95.6 96.1  PLT 178 140* 366*   Basic Metabolic Panel: Recent Labs  Lab  10/31/19 1142 11/01/19 0418 11/02/19 0340  NA 144 146* 147*  K 4.1 4.7 4.2  CL 112* 114* 114*  CO2 23 25 22   GLUCOSE 153* 150* 119*  BUN 45* 53* 58*  CREATININE 2.95* 3.57* 3.50*  CALCIUM 9.7 9.5 9.7   GFR: Estimated Creatinine Clearance: 17.1 mL/min (A) (by C-G formula based on SCr of 3.5 mg/dL (H)). Liver Function Tests: Recent Labs  Lab 10/31/19 1142  AST 16  ALT 21  ALKPHOS 112  BILITOT 1.0  PROT 6.7  ALBUMIN 3.4*   No results for input(s): LIPASE, AMYLASE in the last 168 hours. No results for input(s): AMMONIA in the last 168 hours. Coagulation Profile: No results for input(s): INR, PROTIME in the last 168 hours. Cardiac Enzymes: No results for input(s): CKTOTAL, CKMB, CKMBINDEX, TROPONINI in the last 168 hours. BNP (last 3 results) No results for input(s): PROBNP in the last 8760 hours. HbA1C: Recent Labs    11/01/19 0418  HGBA1C 6.4*   CBG: Recent Labs  Lab 11/01/19 1704 11/01/19 2017 11/01/19 2352 11/02/19 0338 11/02/19 0347  GLUCAP 120* 184* 149* 113* 116*   Lipid Profile: No results for input(s): CHOL, HDL, LDLCALC, TRIG, CHOLHDL, LDLDIRECT in the last 72 hours. Thyroid Function Tests: No results for input(s): TSH, T4TOTAL, FREET4, T3FREE, THYROIDAB in the last 72 hours. Anemia Panel: No results for input(s): VITAMINB12, FOLATE, FERRITIN, TIBC, IRON, RETICCTPCT in the last  72 hours. Sepsis Labs: No results for input(s): PROCALCITON, LATICACIDVEN in the last 168 hours.  Recent Results (from the past 240 hour(s))  Respiratory Panel by RT PCR (Flu A&B, Covid) - Nasopharyngeal Swab     Status: None   Collection Time: 10/31/19 11:42 AM   Specimen: Nasopharyngeal Swab  Result Value Ref Range Status   SARS Coronavirus 2 by RT PCR NEGATIVE NEGATIVE Final    Comment: (NOTE) SARS-CoV-2 target nucleic acids are NOT DETECTED.  The SARS-CoV-2 RNA is generally detectable in upper respiratoy specimens during the acute phase of infection. The  lowest concentration of SARS-CoV-2 viral copies this assay can detect is 131 copies/mL. A negative result does not preclude SARS-Cov-2 infection and should not be used as the sole basis for treatment or other patient management decisions. A negative result may occur with  improper specimen collection/handling, submission of specimen other than nasopharyngeal swab, presence of viral mutation(s) within the areas targeted by this assay, and inadequate number of viral copies (<131 copies/mL). A negative result must be combined with clinical observations, patient history, and epidemiological information. The expected result is Negative.  Fact Sheet for Patients:  PinkCheek.be  Fact Sheet for Healthcare Providers:  GravelBags.it  This test is no t yet approved or cleared by the [place] FDA and  has been authorized for detection and/or diagnosis of SARS-CoV-2 by FDA under an Emergency Use Authorization (EUA). This EUA will remain  in effect (meaning this test can be used) for the duration of the COVID-19 declaration under Section 564(b)(1) of the Act, 21 U.S.C. section 360bbb-3(b)(1), unless the authorization is terminated or revoked sooner.     Influenza A by PCR NEGATIVE NEGATIVE Final   Influenza B by PCR NEGATIVE NEGATIVE Final    Comment: (NOTE) The Xpert Xpress SARS-CoV-2/FLU/RSV assay is intended as an aid in  the diagnosis of influenza from Nasopharyngeal swab specimens and  should not be used as a sole basis for treatment. Nasal washings and  aspirates are unacceptable for Xpert Xpress SARS-CoV-2/FLU/RSV  testing.  Fact Sheet for Patients: PinkCheek.be  Fact Sheet for Healthcare Providers: GravelBags.it  This test is not yet approved or cleared by the [place] FDA and  has been authorized for detection and/or diagnosis of SARS-CoV-2 by  FDA under  an Emergency Use Authorization (EUA). This EUA will remain  in effect (meaning this test can be used) for the duration of the  Covid-19 declaration under Section 564(b)(1) of the Act, 21  U.S.C. section 360bbb-3(b)(1), unless the authorization is  terminated or revoked. Performed at [facility], [address] [place], [place] [address]          Radiology Studies: DG Chest Portable 1 View  Result Date: 10/31/2019 CLINICAL DATA:  Shortness of breath EXAM: PORTABLE CHEST 1 VIEW COMPARISON:  October 10, 2019 FINDINGS: Lungs are clear. Heart is mildly enlarged with pulmonary vascularity normal. There is aortic atherosclerosis. No adenopathy. No bone lesions. IMPRESSION: Mild cardiac enlargement.  No edema or airspace opacity. Aortic Atherosclerosis (ICD10-I70.0). Electronically Signed   By: Lowella Grip III M.D.   On: 10/31/2019 12:27        Scheduled Meds: . allopurinol  200 mg Oral Daily  . apixaban  2.5 mg Oral BID  . aspirin EC  81 mg Oral Daily  . atorvastatin  40 mg Oral Daily  . buPROPion  150 mg Oral Daily  . busPIRone  5 mg Oral Daily  . cholecalciferol  2,000 Units Oral Daily  .  diltiazem  180 mg Oral Daily  . finasteride  5 mg Oral Daily  . insulin aspart  0-15 Units Subcutaneous Q4H  . memantine  5 mg Oral BID  . metoprolol succinate  25 mg Oral Daily   Continuous Infusions: . sodium chloride 75 mL/hr at 11/01/19 1736  . ampicillin-sulbactam (UNASYN) IV Stopped (11/02/19 0243)    Assessment & Plan:   Principal Problem:   Aspiration pneumonia ([place]) Active Problems:   Coronary artery disease   Diabetes mellitus without complication (HCC)   Dementia (HCC)   Persistent atrial fibrillation (HCC)   Acute respiratory failure with hypoxia (HCC)   Presumed aspiration pneumonia As evidenced by tachypnea, hypoxia and rhonchi at the right lung base in a patient with increased secretions requiring suctioning 10/19-continue on Unasyn needs 5 days  total of antibiotic treatment  SPL recommended thin liquid dysphagia 1 pured diet     Acute respiratory failure with hypoxia Most likely secondary to aspiration pneumonia Patient had room air pulse oximetry in the 80s with associated tachypnea and initially required nonrebreather mask to maintain pulse oximetry.  92% 10/19-weaned to room air. Keep 02 >92%.  Will get PT/OT If takes po , can transition to po abx if he remains stable overnight.      Chronic atrial fibrillation-rate controlled Continue metoprolol and diltiazem  Continue Eliquis     Acute /ckd-baseline around 2.7-2.9 Creatinine today 3.50, mildly elevated Diuretic on hold Continue IV fluids Avoid nephrotoxic meds Continue to increase will consider nephrology consultation    Dementia Continue on BuSpar, Wellbutrin and Namenda  Palliative care consulted please refer to note     Coronary artery disease Continue aspirin, statin and metoprolol      Diabetes mellitus Started on dysphagia 1 diet  Continue      DVT prophylaxis: Eliquis Code Status: DNR Family Communication:  Status is: Inpatient  Remains inpatient appropriate because:IV treatments appropriate due to intensity of illness or inability to take PO   Dispo: The patient is from: SNF              Anticipated d/c is to: SNF              Anticipated d/c date is: 2 days              Patient currently is not medically stable to d/c.creatinine worsening, will ck covid. Continue ivf           LOS: 2 days   Time spent: 35 min with >50% on coc    Nolberto Hanlon, MD [facility] Pager [phone]  If 7PM-7AM, please contact night-coverage [email or web address] Password [email or web address] 11/02/2019, 7:25 AM

## 2019-11-02 NOTE — Progress Notes (Signed)
Pt unable to swallow PO medications this morning. Kurtis Bushman, MD notified.  Ronnette Hila, RN

## 2019-11-03 DIAGNOSIS — J690 Pneumonitis due to inhalation of food and vomit: Secondary | ICD-10-CM | POA: Diagnosis not present

## 2019-11-03 DIAGNOSIS — I2510 Atherosclerotic heart disease of native coronary artery without angina pectoris: Secondary | ICD-10-CM | POA: Diagnosis not present

## 2019-11-03 DIAGNOSIS — F0150 Vascular dementia without behavioral disturbance: Secondary | ICD-10-CM | POA: Diagnosis not present

## 2019-11-03 DIAGNOSIS — J9601 Acute respiratory failure with hypoxia: Secondary | ICD-10-CM | POA: Diagnosis not present

## 2019-11-03 LAB — BASIC METABOLIC PANEL
Anion gap: 9 (ref 5–15)
BUN: 50 mg/dL — ABNORMAL HIGH (ref 8–23)
CO2: 22 mmol/L (ref 22–32)
Calcium: 9.5 mg/dL (ref 8.9–10.3)
Chloride: 112 mmol/L — ABNORMAL HIGH (ref 98–111)
Creatinine, Ser: 2.78 mg/dL — ABNORMAL HIGH (ref 0.61–1.24)
GFR, Estimated: 21 mL/min — ABNORMAL LOW (ref >60)
Glucose, Bld: 124 mg/dL — ABNORMAL HIGH (ref 70–99)
Potassium: 3.9 mmol/L (ref 3.5–5.1)
Sodium: 143 mmol/L (ref 135–145)

## 2019-11-03 LAB — BLOOD GAS, VENOUS
Acid-Base Excess: 1.6 mmol/L (ref 0.0–2.0)
Bicarbonate: 28.2 mmol/L — ABNORMAL HIGH (ref 20.0–28.0)
O2 Saturation: 52 %
Patient temperature: 37
pCO2, Ven: 50 mmHg (ref 44.0–60.0)
pH, Ven: 7.36 (ref 7.250–7.430)

## 2019-11-03 LAB — GLUCOSE, CAPILLARY
Glucose-Capillary: 124 mg/dL — ABNORMAL HIGH (ref 70–99)
Glucose-Capillary: 130 mg/dL — ABNORMAL HIGH (ref 70–99)
Glucose-Capillary: 130 mg/dL — ABNORMAL HIGH (ref 70–99)
Glucose-Capillary: 136 mg/dL — ABNORMAL HIGH (ref 70–99)
Glucose-Capillary: 149 mg/dL — ABNORMAL HIGH (ref 70–99)
Glucose-Capillary: 158 mg/dL — ABNORMAL HIGH (ref 70–99)

## 2019-11-03 MED ORDER — DEXTROSE 5 % IV SOLN: INTRAVENOUS | Status: DC

## 2019-11-03 MED ORDER — DEXTROSE-NACL 5-0.45 % IV SOLN: INTRAVENOUS | Status: DC

## 2019-11-03 NOTE — Progress Notes (Signed)
PROGRESS NOTE    Eduardo Richards  AQT:[id number] DOB: [birth year] DOA: 10/31/2019 PCP: [name], MD  Brief Narrative:  Eduardo Richards a 79 y.o.malewith medical history significant forpersistentatrial fibrillation on Eliquis,diabetes mellitus with complications ofCKD stage IV, CAD, history of CVA,HTN, HLD, BPH, and mixed Alzheimer's and vascular dementia who presents to the ED via EMS for evaluation of shortness of breath. He was found gurgling and more somnolent. On arrival 02 at 80's, placed on NRB with improvement.had increased secretions.Weaned down to 4L [place].Respiratory viral panel is negative  Patient seen and examined.  Discussed with wife at bedside.  Patient responsive but very lethargic.  Responds in 1-2 word answers.  Has been weaned from supplemental oxygen.  Stable on room air.  No fevers noted over interval.   Assessment & Plan:   Principal Problem:   Aspiration pneumonia (HCC) Active Problems:   Coronary artery disease   Diabetes mellitus without complication (HCC)   Dementia (HCC)   Persistent atrial fibrillation (HCC)   Acute respiratory failure with hypoxia (HCC)  Presumed aspiration pneumonia As evidenced by tachypnea, hypoxia and rhonchi at the right lung base in a patient with increased secretions requiring suctioning Subsequently weaned from supplemental oxygen with improvement in breath sounds Plan: Continue Unasyn, 5 total days.  Last dose 11/04/2019 Continue dysphagia 1 pured diet Aspiration precautions Supplemental oxygen as needed Monitor fever curve  Acute respiratory failure with hypoxia, resolved Most likely secondary to aspiration pneumonia Patient had room air pulse oximetry in the 80s with associated tachypnea and initially required nonrebreather mask to maintain pulse oximetry. 92% 10/19-weaned to room air. Keep 02 >92%.  Will get PT/OT: Recommend SNF but wife wants to take patient home Still not taking much  p.o.  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 Creatinine remains elevated over baseline Improving over interval Plan: Diuretic on hold Continue IV fluids Avoid nephrotoxic meds  Anxiety/depression Dementia Continue on BuSpar, Wellbutrin and Namenda  Palliative care consulted please refer to note  Coronary artery disease Continue aspirin, statin and metoprolol   Diabetes mellitus Moderate sliding scale   DVT prophylaxis: Eliquis Code Status: DNR Family Communication: Wife at bedside Disposition Plan: Status is: Inpatient  Remains inpatient appropriate because:Inpatient level of care appropriate due to severity of illness   Dispo: The patient is from: Home              Anticipated d/c is to: Home              Anticipated d/c date is: 2 days              Patient currently is not medically stable to d/c.  Completed treatment for aspiration pneumonia.  Last dose of Eliquis tomorrow.  Also had AKI on CKD.  Creatinine downtrending.  Anticipate medical readiness for discharge in 48 hours.  Call for discharge, afebrile/no oxygen requirement x24 hours.  Improvement kidney function approaching baseline.  Consultants:   None  Procedures:   None  Antimicrobials:   Unasyn   Subjective: Seen and examined.  Affect flattened.  No specific complaints  Objective: Vitals:   11/02/19 1542 11/02/19 2058 11/03/19 0749 11/03/19 1004  BP: (!) 146/90 134/80 (!) 147/73   Pulse: 99 91 80   Resp: 16 17 18    Temp: 98.4 F (36.9 C) 98.3 F (36.8 C) 98.6 F (37 C)   TempSrc: Axillary Oral  SpO2: 97% 96% 95% 95%  Weight:      Height:        Intake/Output Summary (Last 24 hours) at 11/03/2019 1519 Last data filed at 11/03/2019 1500 Gross per 24 hour  Intake 1152.85 ml  Output 900 ml  Net 252.85 ml   Filed Weights   10/31/19 1156  Weight: 79.4 kg     Examination:  General exam: No acute distress.  Appears lethargic. Respiratory system: Mild bibasilar crackles.  Normal work of breathing.  Room air Cardiovascular system: S1-S2 heard, no murmurs, irregular rhythm, regular rate  Gastrointestinal system: Abdomen is nondistended, soft and nontender. No organomegaly or masses felt. Normal bowel sounds heard. Central nervous system: Alert and oriented x2, lethargic, no focal deficits  extremities: Symmetric 5 x 5 power. Skin: No rashes, lesions or ulcers Psychiatry: Judgement and insight appear impaired. Mood & affect flattened.     Data Reviewed: I have personally reviewed following labs and imaging studies  CBC: Recent Labs  Lab 10/31/19 1142 11/01/19 0418 11/02/19 0340  WBC 8.5 16.8* 10.5  NEUTROABS 5.7  --   --   HGB 16.7 15.8 13.7  HCT 50.9 47.8 42.3  MCV 94.6 95.6 96.1  PLT 178 140* 834*   Basic Metabolic Panel: Recent Labs  Lab 10/31/19 1142 11/01/19 0418 11/02/19 0340 11/03/19 0503  NA 144 146* 147* 143  K 4.1 4.7 4.2 3.9  CL 112* 114* 114* 112*  CO2 23 25 22 22   GLUCOSE 153* 150* 119* 124*  BUN 45* 53* 58* 50*  CREATININE 2.95* 3.57* 3.50* 2.78*  CALCIUM 9.7 9.5 9.7 9.5   GFR: Estimated Creatinine Clearance: 21.5 mL/min (A) (by C-G formula based on SCr of 2.78 mg/dL (H)). Liver Function Tests: Recent Labs  Lab 10/31/19 1142  AST 16  ALT 21  ALKPHOS 112  BILITOT 1.0  PROT 6.7  ALBUMIN 3.4*   No results for input(s): LIPASE, AMYLASE in the last 168 hours. No results for input(s): AMMONIA in the last 168 hours. Coagulation Profile: No results for input(s): INR, PROTIME in the last 168 hours. Cardiac Enzymes: No results for input(s): CKTOTAL, CKMB, CKMBINDEX, TROPONINI in the last 168 hours. BNP (last 3 results) No results for input(s): PROBNP in the last 8760 hours. HbA1C: Recent Labs    11/01/19 0418  HGBA1C 6.4*   CBG: Recent Labs  Lab 11/02/19 2100 11/02/19 2357 11/03/19 0350  11/03/19 0753 11/03/19 1145  GLUCAP 180* 118* 136* 124* 130*   Lipid Profile: No results for input(s): CHOL, HDL, LDLCALC, TRIG, CHOLHDL, LDLDIRECT in the last 72 hours. Thyroid Function Tests: No results for input(s): TSH, T4TOTAL, FREET4, T3FREE, THYROIDAB in the last 72 hours. Anemia Panel: No results for input(s): VITAMINB12, FOLATE, FERRITIN, TIBC, IRON, RETICCTPCT in the last 72 hours. Sepsis Labs: No results for input(s): PROCALCITON, LATICACIDVEN in the last 168 hours.  Recent Results (from the past 240 hour(s))  Respiratory Panel by RT PCR (Flu A&B, Covid) - Nasopharyngeal Swab     Status: None   Collection Time: 10/31/19 11:42 AM   Specimen: Nasopharyngeal Swab  Result Value Ref Range Status   SARS Coronavirus 2 by RT PCR NEGATIVE NEGATIVE Final    Comment: (NOTE) SARS-CoV-2 target nucleic acids are NOT DETECTED.  The SARS-CoV-2 RNA is generally detectable in upper respiratoy specimens during the acute phase of infection. The lowest concentration of SARS-CoV-2 viral copies this assay can detect is 131 copies/mL. A negative result does not preclude SARS-Cov-2 infection and should not be  used as the sole basis for treatment or other patient management decisions. A negative result may occur with  improper specimen collection/handling, submission of specimen other than nasopharyngeal swab, presence of viral mutation(s) within the areas targeted by this assay, and inadequate number of viral copies (<131 copies/mL). A negative result must be combined with clinical observations, patient history, and epidemiological information. The expected result is Negative.  Fact Sheet for Patients:  PinkCheek.be  Fact Sheet for Healthcare Providers:  GravelBags.it  This test is no t yet approved or cleared by the [place] FDA and  has been authorized for detection and/or diagnosis of SARS-CoV-2 by FDA under an Emergency  Use Authorization (EUA). This EUA will remain  in effect (meaning this test can be used) for the duration of the COVID-19 declaration under Section 564(b)(1) of the Act, 21 U.S.C. section 360bbb-3(b)(1), unless the authorization is terminated or revoked sooner.     Influenza A by PCR NEGATIVE NEGATIVE Final   Influenza B by PCR NEGATIVE NEGATIVE Final    Comment: (NOTE) The Xpert Xpress SARS-CoV-2/FLU/RSV assay is intended as an aid in  the diagnosis of influenza from Nasopharyngeal swab specimens and  should not be used as a sole basis for treatment. Nasal washings and  aspirates are unacceptable for Xpert Xpress SARS-CoV-2/FLU/RSV  testing.  Fact Sheet for Patients: PinkCheek.be  Fact Sheet for Healthcare Providers: GravelBags.it  This test is not yet approved or cleared by the [place] FDA and  has been authorized for detection and/or diagnosis of SARS-CoV-2 by  FDA under an Emergency Use Authorization (EUA). This EUA will remain  in effect (meaning this test can be used) for the duration of the  Covid-19 declaration under Section 564(b)(1) of the Act, 21  U.S.C. section 360bbb-3(b)(1), unless the authorization is  terminated or revoked. Performed at [facility], [address], [place], [place] [address]          Radiology Studies: No results found.      Scheduled Meds:  allopurinol  200 mg Oral Daily   apixaban  2.5 mg Oral BID   aspirin  81 mg Oral Daily   atorvastatin  40 mg Oral Daily   buPROPion  75 mg Oral BID   busPIRone  5 mg Oral Daily   cholecalciferol  2,000 Units Oral Daily   diltiazem  60 mg Oral TID   finasteride  5 mg Oral Daily   insulin aspart  0-15 Units Subcutaneous Q4H   memantine  5 mg Oral BID   metoprolol tartrate  12.5 mg Oral BID   Continuous Infusions:  ampicillin-sulbactam (UNASYN) IV 3 g (11/03/19 1414)   dextrose 5 % and 0.45% NaCl 50  mL/hr at 11/03/19 0859     LOS: 3 days    Time spent: 25 minutes    Sidney Ace, MD [facility] Pager [phone]  If 7PM-7AM, please contact night-coverage 11/03/2019, 3:19 PM

## 2019-11-03 NOTE — Evaluation (Signed)
Physical Therapy Evaluation Patient Details Name: Frantz Quattrone MRN: [id number] DOB: [birth year] Today's Date: 11/03/2019   History of Present Illness  Montague Xzavien Harada is a 79 y.o. 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    Clinical Impression  Patient received in bed, alert, not oriented. Following commands 10% of the time. He requires total assist for bed mobility at this time. Unable to attempted standing for safety. He will continue to benefit from skilled PT while here to improve functional independence and safety.      Follow Up Recommendations SNF    Equipment Recommendations  None recommended by PT    Recommendations for Other Services       Precautions / Restrictions Precautions Precautions: Fall Restrictions Weight Bearing Restrictions: No      Mobility  Bed Mobility Overal bed mobility: Needs Assistance Bed Mobility: Supine to Sit;Sit to Supine     Supine to sit: Total assist Sit to supine: Total assist   General bed mobility comments: Patient with minimal ability to follow direction, therefore all bed mobility is with total assist. Patient Response: Flat affect  Transfers                 General transfer comment: not assessed  Ambulation/Gait             General Gait Details: unsafe to attempt  Stairs            Wheelchair Mobility    Modified Rankin (Stroke Patients Only)       Balance Overall balance assessment: Needs assistance   Sitting balance-Leahy Scale: Zero                                       Pertinent Vitals/Pain Pain Assessment: No/denies pain    Home Living Family/patient expects to be discharged to:: Skilled nursing facility Living  Arrangements: Spouse/significant other Available Help at Discharge: Family;Available 24 hours/day Type of Home: House Home Access: Stairs to enter Entrance Stairs-Rails: Right;Left Entrance Stairs-Number of Steps: 3 or 4 Home Layout: Two level;Other (Comment);Able to live on main level with bedroom/bathroom Home Equipment: Gilford Rile - 2 wheels;Walker - 4 wheels;Cane - quad;Wheelchair - manual;Other (comment) Additional Comments: Patient unable to respond to questions, therefore home information taken from prior admission 3 weeks ago.    Prior Function Level of Independence: Needs assistance   Gait / Transfers Assistance Needed: pt prefer to use assistance from wife to ambulate and uses WC for community ambulation           Hand Dominance        Extremity/Trunk Assessment   Upper Extremity Assessment Upper Extremity Assessment: Defer to OT evaluation    Lower Extremity Assessment Lower Extremity Assessment: Generalized weakness;Difficult to assess due to impaired cognition       Communication   Communication: Expressive difficulties;Receptive difficulties  Cognition Arousal/Alertness: Lethargic Behavior During Therapy: Flat affect Overall Cognitive Status: Impaired/Different from baseline Area of Impairment: Orientation;Memory;Following commands;Safety/judgement;Problem solving;Awareness                 Orientation Level: Disoriented to;Place;Time;Situation   Memory: Decreased short-term memory Following Commands: Follows one step commands inconsistently;Follows one step commands with increased  time     Problem Solving: Slow processing;Requires verbal cues;Decreased initiation General Comments: oriented to self      General Comments      Exercises     Assessment/Plan    PT Assessment Patient needs continued PT services  PT Problem List Decreased strength;Decreased mobility;Decreased activity tolerance;Decreased balance;Decreased safety  awareness;Decreased cognition       PT Treatment Interventions Therapeutic activities;Gait training;Therapeutic exercise;Functional mobility training;Balance training;Patient/family education    PT Goals (Current goals can be found in the Care Plan section)  Acute Rehab PT Goals Patient Stated Goal: unable to state PT Goal Formulation: Patient unable to participate in goal setting Time For Goal Achievement: 11/17/19 Potential to Achieve Goals: Poor    Frequency Min 2X/week   Barriers to discharge Inaccessible home environment;Decreased caregiver support patient's wife unable to care for him, plan to return to SNF    Co-evaluation               AM-PAC PT "6 Clicks" Mobility  Outcome Measure Help needed turning from your back to your side while in a flat bed without using bedrails?: Total Help needed moving from lying on your back to sitting on the side of a flat bed without using bedrails?: Total Help needed moving to and from a bed to a chair (including a wheelchair)?: Total Help needed standing up from a chair using your arms (e.g., wheelchair or bedside chair)?: Total Help needed to walk in hospital room?: Total Help needed climbing 3-5 steps with a railing? : Total 6 Click Score: 6    End of Session   Activity Tolerance: Patient limited by lethargy;Other (comment) (limited by poor cognition) Patient left: in bed;with bed alarm set;with call bell/phone within reach Nurse Communication: Mobility status PT Visit Diagnosis: Muscle weakness (generalized) (M62.81);Other abnormalities of gait and mobility (R26.89)    Time: 0945-1000 PT Time Calculation (min) (ACUTE ONLY): 15 min   Charges:   PT Evaluation $PT Eval Moderate Complexity: 1 Mod          Retta Pitcher, PT, GCS 11/03/19,10:16 AM

## 2019-11-03 NOTE — Progress Notes (Signed)
Verbal order by MD to d/c covid swab. Order d/c'd

## 2019-11-03 NOTE — TOC Initial Note (Signed)
Transition of Care [facility]) - Initial/Assessment Note    Patient Details  Name: Eduardo Richards MRN: [id number] Date of Birth: 04-24-1940  Transition of Care [facility]) CM/SW Contact:    Shelbie Ammons, RN Phone Number: 11/03/2019, 3:40 PM  Clinical Narrative:   RNCM met with patient and wife in room, patient sleeping. Discussed discharge planning with wife and at this time she is reporting that she wants to take patient home and she will not be sending him back to [facility]. Wife reports that she is already in communications with Desmond Dike with [facility] P[facility] services and wants [facility] back. Wife also verbalizing that she will need a hospital bed. Wife noted to be anxious about being able to arrange everything that needs to be arranged to get patient home. MD to bedside to speak with wife.   RNCM placed call to Desmond Dike to discuss discharge planning, she reports that she will work on her staffing in preparation to get patient home.       RNCM reached out to [place] with [facility] to provide home health services.   RNCM reached out to [facility] with [facility] to request they contact wife to discuss hospital bed.           Expected Discharge Plan: [place] Barriers to Discharge: Continued Medical Work up   Patient Goals and CMS Choice        Expected Discharge Plan and Services Expected Discharge Plan: [place] arrangements for the past 2 months: [place] Expected Discharge Date: 11/05/19               DME Arranged: Hospital bed DME Agency: [facility] Date DME Agency Contacted: 11/03/19 Time DME Agency Contacted: [phone] Representative spoke with at DME Agency: Mardene Celeste HH Arranged: RN, PT, OT, Nurse's Aide, Social Work [place] Date [place]: 11/03/19 Time [place]: 48 Representative spoke with at [place]: Malachy Mood  Prior Living  Arrangements/Services Living arrangements for the past 2 months: [place] Lives with:: Spouse Patient language and need for interpreter reviewed:: Yes Do you feel safe going back to the place where you live?: Yes      Need for Family Participation in Patient Care: Yes (Comment) Care giver support system in place?: Yes (comment)   Criminal Activity/Legal Involvement Pertinent to Current Situation/Hospitalization: No - Comment as needed  Activities of Daily Living Home Assistive Devices/Equipment: Environmental consultant (specify type), Cane (specify quad or straight) ADL Screening (condition at time of admission) Patient's cognitive ability adequate to safely complete daily activities?: Yes Is the patient deaf or have difficulty hearing?: No Does the patient have difficulty seeing, even when wearing glasses/contacts?: No Does the patient have difficulty concentrating, remembering, or making decisions?: Yes Patient able to express need for assistance with ADLs?: No Does the patient have difficulty dressing or bathing?: Yes Independently performs ADLs?: No Communication: Needs assistance Dressing (OT): Needs assistance Grooming: Needs assistance Feeding: Needs assistance Bathing: Needs assistance Toileting: Needs assistance In/Out Bed: Needs assistance Walks in Home: Needs assistance Does the patient have difficulty walking or climbing stairs?: Yes Weakness of Legs: Both Weakness of Arms/Hands: None  Permission Sought/Granted                  Emotional Assessment Appearance:: Appears stated age       Alcohol / Substance Use: Not Applicable Psych Involvement: No (  comment)  Admission diagnosis:  Aspiration pneumonia ([place]) [J69.0] Acute respiratory failure with hypoxia (HCC) [J96.01] Aspiration pneumonia, unspecified aspiration pneumonia type, unspecified laterality, unspecified part of lung ([place]) [J69.0] Patient Active Problem List   Diagnosis Date Noted  . Aspiration  pneumonia ([place]) 10/31/2019  . Acute respiratory failure with hypoxia ([place]) 10/31/2019  . E. coli UTI 10/09/2019  . Sepsis due to urinary tract infection ([place]) 10/05/2019  . Coronary artery disease   . Diabetes mellitus without complication ([place])   . Dementia ([place])   . Hypertension associated with diabetes ([place])   . Hyperlipidemia associated with type 2 diabetes mellitus ([place])   . CKD (chronic kidney disease), stage IV ([place])   . Hypokalemia   . Persistent atrial fibrillation ([place])    PCP:  [name], MD Pharmacy:   [facility] [place] [facility] [address] AT [place] [facility] [place] [address] Phone: [phone] Fax: [phone]  [id number] [place] [place]Phone: [phone]: [phone]    Social Determinants of Health (SDOH) Interventions    Readmission Risk Interventions No flowsheet data found.

## 2019-11-03 NOTE — Evaluation (Signed)
Occupational Therapy Evaluation Patient Details Name: Eduardo Richards MRN: [id number] DOB: 1940/12/06 Today's Date: 11/03/2019    History of Present Illness Eduardo Richards is a 79 y.o. 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   Clinical Impression   Pt was seen for OT evaluation this date. Prior to hospital admission, pt was living in a skilled nursing facility. Pt is not oriented to person, place, or time and is non-responsive to any questions or efforts to engage. Even with multimodal cues and hand-over-hand assistance, pt is totally dependant in grooming, self-feeding, and bed mobility. Given pt's non-responsiveness to therapy attempts and very poor rehabilitation potential, do not recommend skilled OT services. DC to pt's prior residence in SNF is appropriate.     Follow Up Recommendations  SNF    Equipment Recommendations  None recommended by OT    Recommendations for Other Services       Precautions / Restrictions Precautions Precautions: Fall Restrictions Weight Bearing Restrictions: No      Mobility Bed Mobility Overal bed mobility: Needs Assistance Bed Mobility: Rolling Rolling: Total assist   Supine to sit: Total assist Sit to supine: Total assist   General bed mobility comments: Patient with minimal ability to follow direction, therefore all bed mobility is with total assist.    Transfers                 General transfer comment: not assessed    Balance Overall balance assessment: Needs assistance   Sitting balance-Leahy Scale: Zero Sitting balance - Comments: pt unable to come to sitting       Standing balance comment: unable to test                           ADL either performed  or assessed with clinical judgement   ADL Overall ADL's : Needs assistance/impaired Eating/Feeding: Total assistance   Grooming: Maximal assistance                                       Vision   Additional Comments: pt presents with one eye half-open, one eye fully closed     Perception     Praxis      Pertinent Vitals/Pain Pain Assessment: No/denies pain (pt does not respond to questions re: pain. He does not display any visual or verbal signs indicative of pain)     Hand Dominance     Extremity/Trunk Assessment Upper Extremity Assessment Upper Extremity Assessment: Generalized weakness   Lower Extremity Assessment Lower Extremity Assessment: Generalized weakness       Communication Communication Communication: Expressive difficulties;Receptive difficulties   Cognition Arousal/Alertness: Lethargic Behavior During Therapy: Flat affect Overall Cognitive Status: History of cognitive impairments - at baseline Area of Impairment: Orientation;Awareness;Attention;Problem solving;Memory;Following commands                 Orientation Level: Disoriented to;Place;Time;Situation   Memory: Decreased short-term memory Following Commands:  (does not follow commands)     Problem Solving: Slow processing;Requires verbal cues;Decreased initiation General Comments: Pt not oriented to self, place, or time   General Comments       Exercises Other Exercises  Other Exercises: attempted to engage pt in grooming, self feeding, bed mobility; pt non-responsive to all efforts   Shoulder Instructions      Home Living Family/patient expects to be discharged to:: Skilled nursing facility Living Arrangements: Other (Comment) (SNF) Available Help at Discharge: [place];Available 24 hours/day Type of Home: House Home Access: Stairs to enter [employer] of Steps: 3 or 4 Entrance Stairs-Rails: Right;Left Home Layout: Two level;Other  (Comment);Able to live on main level with bedroom/bathroom Alternate Level Stairs-Number of Steps: 11 Alternate Level Stairs-Rails: Right     Bathroom Toilet: Handicapped height Bathroom Accessibility: Yes   Home Equipment: Walker - 2 wheels;Walker - 4 wheels;Cane - quad;Wheelchair - Education administrator (comment)   Additional Comments: Pt unable to contribute any information re: PLOF      Prior Functioning/Environment Level of Independence: Needs assistance  Gait / Transfers Assistance Needed: pt prefer to use assistance from wife to ambulate and uses WC for community ambulation              OT Problem List: Decreased strength;Decreased activity tolerance;Decreased cognition      OT Treatment/Interventions:      OT Goals(Current goals can be found in the care plan section) Acute Rehab OT Goals Patient Stated Goal: unable to state  OT Frequency:     Barriers to D/C:            Co-evaluation              AM-PAC OT "6 Clicks" Daily Activity     Outcome Measure Help from another person eating meals?: Total Help from another person taking care of personal grooming?: A Lot Help from another person toileting, which includes using toliet, bedpan, or urinal?: Total Help from another person bathing (including washing, rinsing, drying)?: Total Help from another person to put on and taking off regular upper body clothing?: Total Help from another person to put on and taking off regular lower body clothing?: Total 6 Click Score: 7   End of Session    Activity Tolerance: Patient limited by lethargy;Other (comment) (pt limited by cognitive impairment) Patient left: in bed;with bed alarm set;with call bell/phone within reach  OT Visit Diagnosis: Muscle weakness (generalized) (M62.81)                Time: 2202-5427 OT Time Calculation (min): 8 min Charges:  OT General Charges $OT Visit: 1 Visit OT Evaluation $OT Eval Low Complexity: 1 Low  Josiah Lobo, PhD, MS,  OTR/L ascom [phone] 11/03/19, 11:01 AM

## 2019-11-04 DIAGNOSIS — J9601 Acute respiratory failure with hypoxia: Secondary | ICD-10-CM | POA: Diagnosis not present

## 2019-11-04 DIAGNOSIS — F0150 Vascular dementia without behavioral disturbance: Secondary | ICD-10-CM | POA: Diagnosis not present

## 2019-11-04 DIAGNOSIS — J690 Pneumonitis due to inhalation of food and vomit: Secondary | ICD-10-CM | POA: Diagnosis not present

## 2019-11-04 DIAGNOSIS — I2510 Atherosclerotic heart disease of native coronary artery without angina pectoris: Secondary | ICD-10-CM | POA: Diagnosis not present

## 2019-11-04 LAB — BASIC METABOLIC PANEL
Anion gap: 7 (ref 5–15)
BUN: 38 mg/dL — ABNORMAL HIGH (ref 8–23)
CO2: 24 mmol/L (ref 22–32)
Calcium: 9.7 mg/dL (ref 8.9–10.3)
Chloride: 110 mmol/L (ref 98–111)
Creatinine, Ser: 2.69 mg/dL — ABNORMAL HIGH (ref 0.61–1.24)
GFR, Estimated: 23 mL/min — ABNORMAL LOW (ref >60)
Glucose, Bld: 109 mg/dL — ABNORMAL HIGH (ref 70–99)
Potassium: 4.3 mmol/L (ref 3.5–5.1)
Sodium: 141 mmol/L (ref 135–145)

## 2019-11-04 LAB — CBC WITH DIFFERENTIAL/PLATELET
Abs Immature Granulocytes: 0.09 10*3/uL — ABNORMAL HIGH (ref 0.00–0.07)
Basophils Absolute: 0.1 10*3/uL (ref 0.0–0.1)
Basophils Relative: 1 %
Eosinophils Absolute: 0.2 10*3/uL (ref 0.0–0.5)
Eosinophils Relative: 2 %
HCT: 42.7 % (ref 39.0–52.0)
Hemoglobin: 13.9 g/dL (ref 13.0–17.0)
Immature Granulocytes: 1 %
Lymphocytes Relative: 23 %
Lymphs Abs: 2 10*3/uL (ref 0.7–4.0)
MCH: 31 pg (ref 26.0–34.0)
MCHC: 32.6 g/dL (ref 30.0–36.0)
MCV: 95.1 fL (ref 80.0–100.0)
Monocytes Absolute: 0.5 10*3/uL (ref 0.1–1.0)
Monocytes Relative: 6 %
Neutro Abs: 5.8 10*3/uL (ref 1.7–7.7)
Neutrophils Relative %: 67 %
Platelets: 123 10*3/uL — ABNORMAL LOW (ref 150–400)
RBC: 4.49 MIL/uL (ref 4.22–5.81)
RDW: 13.2 % (ref 11.5–15.5)
WBC: 8.7 10*3/uL (ref 4.0–10.5)
nRBC: 0 % (ref 0.0–0.2)

## 2019-11-04 LAB — GLUCOSE, CAPILLARY
Glucose-Capillary: 109 mg/dL — ABNORMAL HIGH (ref 70–99)
Glucose-Capillary: 130 mg/dL — ABNORMAL HIGH (ref 70–99)
Glucose-Capillary: 103 mg/dL — ABNORMAL HIGH (ref 70–99)
Glucose-Capillary: 153 mg/dL — ABNORMAL HIGH (ref 70–99)
Glucose-Capillary: 128 mg/dL — ABNORMAL HIGH (ref 70–99)

## 2019-11-04 MED ORDER — HYDRALAZINE HCL 20 MG/ML IJ SOLN
10.0000 mg | INTRAMUSCULAR | Status: DC | PRN
  Administered 2019-11-04 – 2019-11-05 (×2): 10 mg via INTRAVENOUS
  Filled 2019-11-04 (×2): qty 1

## 2019-11-04 MED ORDER — METOPROLOL TARTRATE 5 MG/5ML IV SOLN
5.0000 mg | Freq: Four times a day (QID) | INTRAVENOUS | Status: DC
  Administered 2019-11-04 – 2019-11-05 (×2): 5 mg via INTRAVENOUS
  Filled 2019-11-04 (×2): qty 5

## 2019-11-04 MED ORDER — METOPROLOL TARTRATE 25 MG PO TABS
25.0000 mg | ORAL_TABLET | Freq: Two times a day (BID) | ORAL | Status: DC
  Filled 2019-11-04: qty 1

## 2019-11-04 NOTE — Progress Notes (Addendum)
Daily Progress Note   Patient Name: Eduardo Richards       Date: 11/04/2019 DOB: 1940/03/31  Age: 79 y.o. MRN#: [id number] Attending Physician: Sidney Ace, MD Primary Care Physician: [name], MD Admit Date: 10/31/2019  Reason for Consultation/Follow-up: Establishing goals of care  Subjective: Patient is resting in bed; he does not speak but eyes are open. Wife is at bedside. She states he is not eating or drinking well today, but is hopeful getting him home will help. She is aware of palliative recommendation with transition to hospice depending on how he does. Questions answered.    Length of Stay: 4  Current Medications: Scheduled Meds:  . allopurinol  200 mg Oral Daily  . apixaban  2.5 mg Oral BID  . aspirin  81 mg Oral Daily  . atorvastatin  40 mg Oral Daily  . buPROPion  75 mg Oral BID  . busPIRone  5 mg Oral Daily  . cholecalciferol  2,000 Units Oral Daily  . diltiazem  60 mg Oral TID  . finasteride  5 mg Oral Daily  . insulin aspart  0-15 Units Subcutaneous Q4H  . memantine  5 mg Oral BID  . metoprolol tartrate  12.5 mg Oral BID    Continuous Infusions: . ampicillin-sulbactam (UNASYN) IV 3 g (11/04/19 0142)  . dextrose 5 % and 0.45% NaCl 50 mL/hr at 11/03/19 0859    PRN Meds: hydrALAZINE  Physical Exam Pulmonary:     Effort: Pulmonary effort is normal.  Neurological:     Mental Status: He is alert.             Vital Signs: BP (!) 141/111 (BP Location: Right Arm)   Pulse 82   Temp 97.8 F (36.6 C) (Oral)   Resp 18   Ht 5\' 9"  (1.753 m)   Wt 79.4 kg   SpO2 95%   BMI 25.84 kg/m  SpO2: SpO2: 95 % O2 Device: O2 Device: Room Air O2 Flow Rate: O2 Flow Rate (L/min): 2 L/min  Intake/output summary:   Intake/Output Summary (Last 24  hours) at 11/04/2019 1234 Last data filed at 11/04/2019 1038 Gross per 24 hour  Intake 912.85 ml  Output 2450 ml  Net -1537.15 ml   LBM: Last BM Date: 11/03/19 Baseline Weight: Weight: 79.4 kg Most recent weight:  Weight: 79.4 kg     Patient Active Problem List   Diagnosis Date Noted  . Aspiration pneumonia ([place]) 10/31/2019  . Acute respiratory failure with hypoxia ([place]) 10/31/2019  . E. coli UTI 10/09/2019  . Sepsis due to urinary tract infection ([place]) 10/05/2019  . Coronary artery disease   . Diabetes mellitus without complication ([place])   . Dementia ([place])   . Hypertension associated with diabetes ([place])   . Hyperlipidemia associated with type 2 diabetes mellitus ([place])   . CKD (chronic kidney disease), stage IV ([place])   . Hypokalemia   . Persistent atrial fibrillation [facility])     Palliative Care Assessment & Plan     Recommendations/Plan:  Recommend palliative at D/C with transition to hospice when ready.     Code Status:    Code Status Orders  (From admission, onward)         Start     Ordered   10/31/19 1606  Do not attempt resuscitation (DNR)  Continuous       Question Answer Comment  In the event of cardiac or respiratory ARREST Do not call a "code blue"   In the event of cardiac or respiratory ARREST Do not perform Intubation, CPR, defibrillation or ACLS   In the event of cardiac or respiratory ARREST Use medication by any route, position, wound care, and other measures to relive pain and suffering. May use oxygen, suction and manual treatment of airway obstruction as needed for comfort.   Comments CODE STATUS was discussed with patient's wife at the bedside and he is a DNR      10/31/19 1607        Code Status History    Date Active Date Inactive Code Status Order ID Comments User Context   10/06/2019 0007 10/12/2019 2236 DNR [id number]  Lenore Cordia, MD ED   Advance Care Planning Activity    Advance Directive Documentation     Most Recent Value    Type of Advance Directive Out of facility DNR (pink MOST or yellow form)  Pre-existing out of facility DNR order (yellow form or pink MOST form) --  "MOST" Form in Place? --       Prognosis:   < 6 months   Thank you for allowing the Palliative Medicine Team to assist in the care of this patient.   Total Time 35 min Prolonged Time Billed no      Greater than 50%  of this time was spent counseling and coordinating care related to the above assessment and plan.  Asencion Gowda, NP  Please contact Palliative Medicine Team phone at [phone] for questions and concerns.

## 2019-11-04 NOTE — Progress Notes (Signed)
Pt with elevating reading of BP. This nurse was notified automatic reading BP:179/100 Re-check BP manually at 16:23. BP: 160/76. Administered oral BP med crushed with Magic cup.  Pt with difficulty swallowing. Pt pocketing medications and Food. Implementing yaunker oral suctioning at this time. MD notified. Per MD, PRN hydralazine given if BP without improvement. Obtained new set of BP at 17:50, BP:160/70. PRN 10 mg Hydralazine given at this time. MD made aware.  Wife at bedside. Implementing/educating wife on Aspiration. Will monitor.

## 2019-11-04 NOTE — Care Management Important Message (Signed)
Important Message  Patient Details  Name: Eduardo Richards MRN: [id number] Date of Birth: 1940/06/07   Medicare Important Message Given:  Yes     Juliann Pulse A Kalissa Grays 11/04/2019, 11:09 AM

## 2019-11-04 NOTE — Progress Notes (Signed)
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 He was found gurgling and more somnolent. On arrival 02 at 80's, placed on NRB with improvement.had increased secretions.Weaned down to 4L [place].Respiratory viral panel is negative  Patient seen and examined.  Remains on room air.  No family at bedside this morning.  Patient sleeping and appears to be talking in his sleep.  When awakened he is somewhat confused and lethargic.  Hemodynamically stable.   Assessment & Plan:   Principal Problem:   Aspiration pneumonia ([place]) Active Problems:   Coronary artery disease   Diabetes mellitus without complication (HCC)   Dementia (HCC)   Persistent atrial fibrillation (HCC)   Acute respiratory failure with hypoxia (HCC)  Presumed aspiration pneumonia As evidenced by tachypnea, hypoxia and rhonchi at the right lung base in a patient with increased secretions requiring suctioning Subsequently weaned from supplemental oxygen with improvement in breath sounds Plan: Continue Unasyn.  Plan for 5-day course.  Last dose 11/04/2019 Continue dysphagia 1 pured diet Aspiration precautions Supplemental oxygen as needed.  Currently weaned to room air Monitor fever curve  Acute respiratory failure with hypoxia, resolved Most likely secondary to aspiration pneumonia Patient had room air pulse oximetry in the 80s with associated tachypnea and initially required nonrebreather mask to maintain pulse oximetry. 92% 10/19-weaned to room air. Keep 02 >92%.  Will get PT/OT: Recommend SNF but wife wants to take  patient home Still not taking much p.o.  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 Creatinine remains elevated over baseline Improving over interval Plan: Diuretic on hold Continue IV fluids Avoid nephrotoxic meds  Anxiety/depression Dementia Continue on BuSpar, Wellbutrin and Namenda  Palliative care consulted please refer to note  Coronary artery disease Continue aspirin, statin and metoprolol   Diabetes mellitus Moderate sliding scale   DVT prophylaxis: Eliquis Code Status: DNR Family Communication: Wife at bedside 11/03/2019 Disposition Plan: Status is: Inpatient  Remains inpatient appropriate because:Inpatient level of care appropriate due to severity of illness   Dispo: The patient is from: Home              Anticipated d/c is to: Home              Anticipated d/c date is: 2 days              Patient currently is not medically stable to d/c.  We will complete last dose of treatment for aspiration ammonia today.  Wife is electing to take patient back home with home health services.  She is aware of recommendations for skilled nursing facility.  Case management is involved and patient can discharge to home with home health services once services have been confirmed and all DME delivered to patient house.  Consultants:   None  Procedures:   None  Antimicrobials:   Unasyn   Subjective: Seen and examined.  Lethargic.  Affect flattened.  Nonspecific complaints  Objective: Vitals:   11/03/19 1004 11/03/19 1545 11/03/19 2338 11/04/19 0731  BP:  (!) 143/86 (!) 164/107 (!) 141/111  Pulse:  93 96 82  Resp:  18 18 18   Temp:  98.4 F (36.9 C) 98.7 F (37.1 C) 97.8 F (36.6 C)  TempSrc:   Axillary Oral  SpO2: 95% 97% 97% 95%  Weight:      Height:        Intake/Output Summary (Last 24 hours) at 11/04/2019 1230 Last data filed at  11/04/2019 1038 Gross per 24 hour  Intake 912.85 ml  Output 2450 ml  Net -1537.15 ml   Filed Weights   10/31/19 1156  Weight: 79.4 kg    Examination:  General exam: No acute distress.  Appears lethargic. Respiratory system: Mild bibasilar crackles.  Normal work of breathing.  Room air Cardiovascular system: S1-S2 heard, no murmurs, irregular rhythm, regular rate  Gastrointestinal system: Abdomen is nondistended, soft and nontender. No organomegaly or masses felt. Normal bowel sounds heard. Central nervous system: Alert and oriented x2, lethargic, no focal deficits  extremities: Symmetric 5 x 5 power. Skin: No rashes, lesions or ulcers Psychiatry: Judgement and insight appear impaired. Mood & affect flattened.     Data Reviewed: I have personally reviewed following labs and imaging studies  CBC: Recent Labs  Lab 10/31/19 1142 11/01/19 0418 11/02/19 0340 11/04/19 0820  WBC 8.5 16.8* 10.5 8.7  NEUTROABS 5.7  --   --  5.8  HGB 16.7 15.8 13.7 13.9  HCT 50.9 47.8 42.3 42.7  MCV 94.6 95.6 96.1 95.1  PLT 178 140* 137* 169*   Basic Metabolic Panel: Recent Labs  Lab 10/31/19 1142 11/01/19 0418 11/02/19 0340 11/03/19 0503 11/04/19 0820  NA 144 146* 147* 143 141  K 4.1 4.7 4.2 3.9 4.3  CL 112* 114* 114* 112* 110  CO2 23 25 22 22 24   GLUCOSE 153* 150* 119* 124* 109*  BUN 45* 53* 58* 50* 38*  CREATININE 2.95* 3.57* 3.50* 2.78* 2.69*  CALCIUM 9.7 9.5 9.7 9.5 9.7   GFR: Estimated Creatinine Clearance: 22.3 mL/min (A) (by C-G formula based on SCr of 2.69 mg/dL (H)). Liver Function Tests: Recent Labs  Lab 10/31/19 1142  AST 16  ALT 21  ALKPHOS 112  BILITOT 1.0  PROT 6.7  ALBUMIN 3.4*   No results for input(s): LIPASE, AMYLASE in the last 168 hours. No results for input(s): AMMONIA in the last 168 hours. Coagulation Profile: No results for input(s): INR, PROTIME in the last 168 hours. Cardiac Enzymes: No results for input(s): CKTOTAL, CKMB, CKMBINDEX, TROPONINI  in the last 168 hours. BNP (last 3 results) No results for input(s): PROBNP in the last 8760 hours. HbA1C: No results for input(s): HGBA1C in the last 72 hours. CBG: Recent Labs  Lab 11/03/19 1952 11/03/19 2353 11/04/19 0433 11/04/19 0728 11/04/19 1151  GLUCAP 158* 130* 109* 130* 103*   Lipid Profile: No results for input(s): CHOL, HDL, LDLCALC, TRIG, CHOLHDL, LDLDIRECT in the last 72 hours. Thyroid Function Tests: No results for input(s): TSH, T4TOTAL, FREET4, T3FREE, THYROIDAB in the last 72 hours. Anemia Panel: No results for input(s): VITAMINB12, FOLATE, FERRITIN, TIBC, IRON, RETICCTPCT in the last 72 hours. Sepsis Labs: No results for input(s): PROCALCITON, LATICACIDVEN in the last 168 hours.  Recent Results (from the past 240 hour(s))  Respiratory Panel by RT PCR (Flu A&B, Covid) - Nasopharyngeal Swab     Status: None   Collection Time: 10/31/19 11:42 AM   Specimen: Nasopharyngeal Swab  Result Value Ref Range Status   SARS Coronavirus 2 by RT PCR NEGATIVE NEGATIVE Final    Comment: (NOTE) SARS-CoV-2 target nucleic acids are NOT DETECTED.  The SARS-CoV-2 RNA is generally detectable in upper respiratoy specimens during the acute phase of infection. The lowest concentration of SARS-CoV-2 viral copies this assay can detect is 131 copies/mL. A negative result does not preclude SARS-Cov-2 infection and should not be used as the sole basis for treatment or other patient management decisions. A negative result may occur with  improper specimen collection/handling, submission of specimen other than nasopharyngeal swab, presence of viral mutation(s) within the areas targeted by this assay, and inadequate number of viral copies (<131 copies/mL). A negative result must be combined with clinical observations, patient history, and epidemiological information. The expected result is Negative.  Fact Sheet for Patients:  PinkCheek.be  Fact Sheet for  Healthcare Providers:  GravelBags.it  This test is no t yet approved or cleared by the [place] FDA and  has been authorized for detection and/or diagnosis of SARS-CoV-2 by FDA under an Emergency Use Authorization (EUA). This EUA will remain  in effect (meaning this test can be used) for the duration of the COVID-19 declaration under Section 564(b)(1) of the Act, 21 U.S.C. section 360bbb-3(b)(1), unless the authorization is terminated or revoked sooner.     Influenza A by PCR NEGATIVE NEGATIVE Final   Influenza B by PCR NEGATIVE NEGATIVE Final    Comment: (NOTE) The Xpert Xpress SARS-CoV-2/FLU/RSV assay is intended as an aid in  the diagnosis of influenza from Nasopharyngeal swab specimens and  should not be used as a sole basis for treatment. Nasal washings and  aspirates are unacceptable for Xpert Xpress SARS-CoV-2/FLU/RSV  testing.  Fact Sheet for Patients: PinkCheek.be  Fact Sheet for Healthcare Providers: GravelBags.it  This test is not yet approved or cleared by the [place] FDA and  has been authorized for detection and/or diagnosis of SARS-CoV-2 by  FDA under an Emergency Use Authorization (EUA). This EUA will remain  in effect (meaning this test can be used) for the duration of the  Covid-19 declaration under Section 564(b)(1) of the Act, 21  U.S.C. section 360bbb-3(b)(1), unless the authorization is  terminated or revoked. Performed at [facility], [address], [place], [place] [address]          Radiology Studies: No results found.      Scheduled Meds: . allopurinol  200 mg Oral Daily  . apixaban  2.5 mg Oral BID  . aspirin  81 mg Oral Daily  . atorvastatin  40 mg Oral Daily  . buPROPion  75 mg Oral BID  . busPIRone  5 mg Oral Daily  . cholecalciferol  2,000 Units Oral Daily  . diltiazem  60 mg Oral TID  . finasteride  5 mg Oral Daily  .  insulin aspart  0-15 Units Subcutaneous Q4H  . memantine  5 mg Oral BID  . metoprolol tartrate  12.5 mg Oral BID   Continuous Infusions: . ampicillin-sulbactam (UNASYN) IV 3 g (11/04/19 0142)  . dextrose 5 % and 0.45% NaCl 50 mL/hr at 11/03/19 0859     LOS: 4 days    Time spent: 25 minutes    Sidney Ace, MD [facility] Pager [phone]  If 7PM-7AM, please contact night-coverage 11/04/2019, 12:30 PM

## 2019-11-05 DIAGNOSIS — J9601 Acute respiratory failure with hypoxia: Secondary | ICD-10-CM | POA: Diagnosis not present

## 2019-11-05 DIAGNOSIS — F0150 Vascular dementia without behavioral disturbance: Secondary | ICD-10-CM | POA: Diagnosis not present

## 2019-11-05 DIAGNOSIS — I2510 Atherosclerotic heart disease of native coronary artery without angina pectoris: Secondary | ICD-10-CM | POA: Diagnosis not present

## 2019-11-05 DIAGNOSIS — J690 Pneumonitis due to inhalation of food and vomit: Secondary | ICD-10-CM | POA: Diagnosis not present

## 2019-11-05 LAB — GLUCOSE, CAPILLARY
Glucose-Capillary: 112 mg/dL — ABNORMAL HIGH (ref 70–99)
Glucose-Capillary: 115 mg/dL — ABNORMAL HIGH (ref 70–99)
Glucose-Capillary: 119 mg/dL — ABNORMAL HIGH (ref 70–99)
Glucose-Capillary: 123 mg/dL — ABNORMAL HIGH (ref 70–99)
Glucose-Capillary: 125 mg/dL — ABNORMAL HIGH (ref 70–99)
Glucose-Capillary: 153 mg/dL — ABNORMAL HIGH (ref 70–99)

## 2019-11-05 MED ORDER — METOPROLOL TARTRATE 5 MG/5ML IV SOLN
5.0000 mg | Freq: Four times a day (QID) | INTRAVENOUS | Status: DC
  Administered 2019-11-05 – 2019-11-06 (×4): 5 mg via INTRAVENOUS
  Filled 2019-11-05 (×4): qty 5

## 2019-11-05 NOTE — Plan of Care (Signed)
  Problem: Respiratory: Goal: Ability to maintain adequate ventilation will improve Outcome: Progressing Goal: Ability to maintain a clear airway will improve Outcome: Progressing   Problem: Coping: Goal: Ability to adjust to condition or change in health will improve Outcome: Progressing   Problem: Fluid Volume: Goal: Ability to maintain a balanced intake and output will improve Outcome: Progressing   Problem: Health Behavior/Discharge Planning: Goal: Ability to identify and utilize available resources and services will improve Outcome: Progressing

## 2019-11-05 NOTE — Progress Notes (Signed)
   11/05/19 1313  Assess: MEWS Score  BP (!) 157/87  Pulse Rate (!) 101  Resp 18  SpO2 96 %  O2 Device Room Air  Assess: MEWS Score  MEWS Temp 0  MEWS Systolic 0  MEWS Pulse 1  MEWS RR 0  MEWS LOC 1  MEWS Score 2  MEWS Score Color Yellow  Assess: if the MEWS score is Yellow or Red  Were vital signs taken at a resting state? Yes  Focused Assessment Change from prior assessment (see assessment flowsheet)  Early Detection of Sepsis Score *See Row Information* Low  MEWS guidelines implemented *See Row Information* Yes  Treat  MEWS Interventions Administered scheduled meds/treatments  Escalate  MEWS: Escalate Yellow: discuss with charge nurse/RN and consider discussing with provider and RRT  Notify: Charge Nurse/RN  Name of Charge Nurse/RN Notified Anderson [place]  Date Charge Nurse/RN Notified 11/05/19  Time Charge Nurse/RN Notified 1315  Document  Patient Outcome Other (Comment) (monitoring)  Progress note created (see row info) Yes

## 2019-11-05 NOTE — Progress Notes (Signed)
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 He was found gurgling and more somnolent. On arrival 02 at 80's, placed on NRB with improvement.had increased secretions.Weaned down to 4L [place].Respiratory viral panel is negative  Patient seen and examined.  Continues to have very poor p.o. intake and mental status.  Persistently sleepy and lethargic.  Unwilling or unable to take pills and food.  Pockets medications when administered.  Nursing unable to safely administer p.o. medications.  Had a lengthy conversation with the wife this morning.  I relayed to him my concerns.  Initially the plan was to send him home with home health services however at this time I do not feel this is safe disposition unless the patient is under comfort/hospice care.  I recommended this to the wife and have offered consultation with our inpatient death or care hospice representatives.  Case management aware.   Assessment & Plan:   Principal Problem:   Aspiration pneumonia ([place]) Active Problems:   Coronary artery disease   Diabetes mellitus without complication (HCC)   Dementia (HCC)   Persistent atrial fibrillation (HCC)   Acute respiratory failure with hypoxia (HCC)  Presumed aspiration pneumonia As evidenced by tachypnea, hypoxia and rhonchi at the right lung base in a patient with increased secretions requiring suctioning Subsequently weaned from supplemental oxygen with improvement in breath sounds Plan: Completed course of Unasyn.  No further antibiotics Continue dysphagia 1 pured diet Aspiration  precautions Supplemental oxygen as needed.  Currently weaned to room air Monitor fever curve  Acute respiratory failure with hypoxia, resolved Most likely secondary to aspiration pneumonia Patient had room air pulse oximetry in the 80s with associated tachypnea and initially required nonrebreather mask to maintain pulse oximetry. 92% 10/19-weaned to room air. Keep 02 >92%.  Will get PT/OT: Recommend SNF but wife wants to take patient home Patient is not taking any p.o. 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 Avoid nephrotoxic meds  Anxiety/depression Dementia Continue on BuSpar, Wellbutrin and Namenda  Palliative care consulted please refer to note  Coronary artery disease Continue aspirin, statin and metoprolol   Diabetes mellitus Moderate sliding scale  Weakness Functional decline Adult failure to thrive Patient has had a waxing and waning hospital course but overall trajectory has been downward.  At this point patient is not taking any p.o. medications nor is he eating.  He remains persistently lethargic and is unable to participate in interview.  Unfortunately I feel this condition is chronic and that continued medical care is unlikely to help him.  I discussed this with the wife and recommended consideration for hospice/comfort measures.  Wife does not want patient to to go to a facility but would rather bring the patient back home.  I said this is an option and discussed with TOC regarding an inpatient hospice consultation.   DVT prophylaxis: Eliquis Code Status: DNR Family Communication: Wife at bedside 11/05/2019 Disposition Plan:  Status is: Inpatient  Remains inpatient  appropriate because:Inpatient level of care appropriate due to severity of illness   Dispo: The patient is from: Home              Anticipated d/c is to: Home              Anticipated d/c date is: 2 days              Patient currently is not medically stable to d/c.  From acute care standpoint patient no longer requiring any intravenous antibiotics.  Initially plan was to discharge home with home health services however patient's clinical status is deteriorated to the point that he is no longer taking p.o. medications nor is eating.  I am concerned about his ability to recover at home and feel his condition is likely chronic and progressive.  I have notified TOC that the wife is agreeable to discuss with hospice representatives about their care and one taken off her post discharge.  Disposition plan pending.  Consultants:   None  Procedures:   None  Antimicrobials:   Unasyn   Subjective: Seen and examined.  Lethargic, sleepy.  Unable to participate in interview  Objective: Vitals:   11/05/19 0038 11/05/19 0402 11/05/19 0749 11/05/19 1156  BP: (!) 161/94 (!) 160/96 (!) 161/96 (!) 174/107  Pulse: 87 82 91 93  Resp: 20 20 18 18   Temp: 98.4 F (36.9 C)  98.9 F (37.2 C)   TempSrc: Axillary  Oral   SpO2: 94%  95% 96%  Weight:      Height:        Intake/Output Summary (Last 24 hours) at 11/05/2019 1257 Last data filed at 11/05/2019 1100 Gross per 24 hour  Intake 1764.07 ml  Output 450 ml  Net 1314.07 ml   Filed Weights   10/31/19 1156  Weight: 79.4 kg    Examination:  General exam: Sleeping, difficult to arouse, appears frail Respiratory system: Mild bibasilar crackles.  Normal work of breathing.  Room air Cardiovascular system: S1-S2 heard, no murmurs, irregular rhythm, regular rate  Gastrointestinal system: Abdomen is nondistended, soft and nontender. No organomegaly or masses felt. Normal bowel sounds heard. Central nervous system: Alert and oriented x 0,  lethargic, no focal deficits  extremities: Unable to assess Skin: No rashes, lesions or ulcers Psychiatry: Very lethargic, unable to assess judgment, insight, mood, affect    Data Reviewed: I have personally reviewed following labs and imaging studies  CBC: Recent Labs  Lab 10/31/19 1142 11/01/19 0418 11/02/19 0340 11/04/19 0820  WBC 8.5 16.8* 10.5 8.7  NEUTROABS 5.7  --   --  5.8  HGB 16.7 15.8 13.7 13.9  HCT 50.9 47.8 42.3 42.7  MCV 94.6 95.6 96.1 95.1  PLT 178 140* 137* 737*   Basic Metabolic Panel: Recent Labs  Lab 10/31/19 1142 11/01/19 0418 11/02/19 0340 11/03/19 0503 11/04/19 0820  NA 144 146* 147* 143 141  K 4.1 4.7 4.2 3.9 4.3  CL 112* 114* 114* 112* 110  CO2 23 25 22 22 24   GLUCOSE 153* 150* 119* 124* 109*  BUN 45* 53* 58* 50* 38*  CREATININE 2.95* 3.57* 3.50* 2.78* 2.69*  CALCIUM 9.7 9.5 9.7 9.5 9.7   GFR: Estimated Creatinine Clearance: 22.3 mL/min (A) (by C-G formula based on SCr of 2.69 mg/dL (H)). Liver Function Tests: Recent Labs  Lab 10/31/19 1142  AST 16  ALT 21  ALKPHOS 112  BILITOT 1.0  PROT 6.7  ALBUMIN 3.4*  No results for input(s): LIPASE, AMYLASE in the last 168 hours. No results for input(s): AMMONIA in the last 168 hours. Coagulation Profile: No results for input(s): INR, PROTIME in the last 168 hours. Cardiac Enzymes: No results for input(s): CKTOTAL, CKMB, CKMBINDEX, TROPONINI in the last 168 hours. BNP (last 3 results) No results for input(s): PROBNP in the last 8760 hours. HbA1C: No results for input(s): HGBA1C in the last 72 hours. CBG: Recent Labs  Lab 11/04/19 1956 11/05/19 0036 11/05/19 0353 11/05/19 0751 11/05/19 1147  GLUCAP 128* 123* 125* 112* 115*   Lipid Profile: No results for input(s): CHOL, HDL, LDLCALC, TRIG, CHOLHDL, LDLDIRECT in the last 72 hours. Thyroid Function Tests: No results for input(s): TSH, T4TOTAL, FREET4, T3FREE, THYROIDAB in the last 72 hours. Anemia Panel: No results for input(s):  VITAMINB12, FOLATE, FERRITIN, TIBC, IRON, RETICCTPCT in the last 72 hours. Sepsis Labs: No results for input(s): PROCALCITON, LATICACIDVEN in the last 168 hours.  Recent Results (from the past 240 hour(s))  Respiratory Panel by RT PCR (Flu A&B, Covid) - Nasopharyngeal Swab     Status: None   Collection Time: 10/31/19 11:42 AM   Specimen: Nasopharyngeal Swab  Result Value Ref Range Status   SARS Coronavirus 2 by RT PCR NEGATIVE NEGATIVE Final    Comment: (NOTE) SARS-CoV-2 target nucleic acids are NOT DETECTED.  The SARS-CoV-2 RNA is generally detectable in upper respiratoy specimens during the acute phase of infection. The lowest concentration of SARS-CoV-2 viral copies this assay can detect is 131 copies/mL. A negative result does not preclude SARS-Cov-2 infection and should not be used as the sole basis for treatment or other patient management decisions. A negative result may occur with  improper specimen collection/handling, submission of specimen other than nasopharyngeal swab, presence of viral mutation(s) within the areas targeted by this assay, and inadequate number of viral copies (<131 copies/mL). A negative result must be combined with clinical observations, patient history, and epidemiological information. The expected result is Negative.  Fact Sheet for Patients:  PinkCheek.be  Fact Sheet for Healthcare Providers:  GravelBags.it  This test is no t yet approved or cleared by the [place] FDA and  has been authorized for detection and/or diagnosis of SARS-CoV-2 by FDA under an Emergency Use Authorization (EUA). This EUA will remain  in effect (meaning this test can be used) for the duration of the COVID-19 declaration under Section 564(b)(1) of the Act, 21 U.S.C. section 360bbb-3(b)(1), unless the authorization is terminated or revoked sooner.     Influenza A by PCR NEGATIVE NEGATIVE Final   Influenza B  by PCR NEGATIVE NEGATIVE Final    Comment: (NOTE) The Xpert Xpress SARS-CoV-2/FLU/RSV assay is intended as an aid in  the diagnosis of influenza from Nasopharyngeal swab specimens and  should not be used as a sole basis for treatment. Nasal washings and  aspirates are unacceptable for Xpert Xpress SARS-CoV-2/FLU/RSV  testing.  Fact Sheet for Patients: PinkCheek.be  Fact Sheet for Healthcare Providers: GravelBags.it  This test is not yet approved or cleared by the [place] FDA and  has been authorized for detection and/or diagnosis of SARS-CoV-2 by  FDA under an Emergency Use Authorization (EUA). This EUA will remain  in effect (meaning this test can be used) for the duration of the  Covid-19 declaration under Section 564(b)(1) of the Act, 21  U.S.C. section 360bbb-3(b)(1), unless the authorization is  terminated or revoked. Performed at [facility], [address], [place], [place] [address]  Radiology Studies: No results found.      Scheduled Meds: . allopurinol  200 mg Oral Daily  . apixaban  2.5 mg Oral BID  . aspirin  81 mg Oral Daily  . atorvastatin  40 mg Oral Daily  . buPROPion  75 mg Oral BID  . busPIRone  5 mg Oral Daily  . cholecalciferol  2,000 Units Oral Daily  . diltiazem  60 mg Oral TID  . finasteride  5 mg Oral Daily  . insulin aspart  0-15 Units Subcutaneous Q4H  . memantine  5 mg Oral BID  . metoprolol tartrate  5 mg Intravenous Q6H   Continuous Infusions: . dextrose 5 % and 0.45% NaCl 50 mL/hr at 11/05/19 1014     LOS: 5 days    Time spent: 25 minutes    Sidney Ace, MD [facility] Pager [phone]  If 7PM-7AM, please contact night-coverage 11/05/2019, 12:57 PM

## 2019-11-05 NOTE — Progress Notes (Addendum)
[facility]) Hospital Liaison RN note:  Received request from Dr. Priscella Mann and Jhonnie Garner, TOc for hospice services after discharge. Chart and patient information under review by [facility] physician. Hospice eligibility was approved.  Spoke with wife, [name] in the room and daughter, Claiborne Billings on the phone to initiate education related to hospice philosophy, services and to answer any questions. Spouse and daughter verbalized understanding of information given. Plan is for discharge possibly this weekend by EMS once suction equipment has been delivered.  DME needs discussed. Patient already has a hospital bed, bedside commode, walker. Spouse requests suction set up for home. Info provided to triage. Address has been verified and contact info is correct in the chart to arrange delivery of equipment.   Please send signed and completed DNR home with patient or family. Please provide prescriptions at discharge as needed to ensure ongoing symptom management.  [facility] information and contact numbers given to [place]. Above information shared with hospital care team.  Please call with any hospice related questions or concerns.  Thank you for the opportunity to participate in this patient's care.  Zandra Abts, RN [facility] Liaison [phone]

## 2019-11-05 NOTE — TOC Progression Note (Signed)
Transition of Care [facility]) - Progression Note    Patient Details  Name: Desean Heemstra MRN: [id number] Date of Birth: Dec 15, 1940  Transition of Care [facility]) CM/SW [place], RN Phone Number: 11/05/2019, 3:34 PM  Clinical Narrative:   RNCM met with patient's wife several times in room today. Wife has ultimately made the decision for Hospice services at home. Initial referral sent to [facility] however they could not accept, referral given to [facility] and they have accepted. Hospital bed was delivered today through [facility]. Hospice will provide suction machine. No other equipment needs at this time.     Expected Discharge Plan: [place] Barriers to Discharge: Continued Medical Work up  Expected Discharge Plan and Services Expected Discharge Plan: [place] arrangements for the past 2 months: [place] Expected Discharge Date: 11/05/19               DME Arranged: Hospital bed DME Agency: [employer] Date DME Agency Contacted: 11/03/19 Time DME Agency Contacted: [id number] Representative spoke with at DME Agency: Mardene Celeste HH Arranged: RN, PT, OT, Nurse's Aide, Social Work [place] Date [place]: 11/03/19 Time [place]: 31 Representative spoke with at [place]: [place] ([place]) Interventions    Readmission Risk Interventions No flowsheet data found.

## 2019-11-05 NOTE — Progress Notes (Signed)
Pt unable to take his oral morning medications. Pt lethargic this morning. Refusing to eat. Pt pocketing medications and Food. MD notified.

## 2019-11-06 DIAGNOSIS — F0150 Vascular dementia without behavioral disturbance: Secondary | ICD-10-CM | POA: Diagnosis not present

## 2019-11-06 DIAGNOSIS — I2510 Atherosclerotic heart disease of native coronary artery without angina pectoris: Secondary | ICD-10-CM | POA: Diagnosis not present

## 2019-11-06 DIAGNOSIS — J9601 Acute respiratory failure with hypoxia: Secondary | ICD-10-CM | POA: Diagnosis not present

## 2019-11-06 DIAGNOSIS — J690 Pneumonitis due to inhalation of food and vomit: Secondary | ICD-10-CM | POA: Diagnosis not present

## 2019-11-06 LAB — GLUCOSE, CAPILLARY
Glucose-Capillary: 114 mg/dL — ABNORMAL HIGH (ref 70–99)
Glucose-Capillary: 128 mg/dL — ABNORMAL HIGH (ref 70–99)
Glucose-Capillary: 109 mg/dL — ABNORMAL HIGH (ref 70–99)
Glucose-Capillary: 125 mg/dL — ABNORMAL HIGH (ref 70–99)
Glucose-Capillary: 128 mg/dL — ABNORMAL HIGH (ref 70–99)

## 2019-11-06 MED ORDER — CLONIDINE HCL 0.1 MG/24HR TD PTWK
0.1000 mg | MEDICATED_PATCH | TRANSDERMAL | Status: DC
  Filled 2019-11-06: qty 1

## 2019-11-06 NOTE — TOC Transition Note (Addendum)
Transition of Care [facility]) - CM/SW Discharge Note   Patient Details  Name: Eduardo Richards MRN: [id number] Date of Birth: 05-May-1940  Transition of Care [facility]) CM/SW Contact:  Boris Sharper, LCSW Phone Number: 11/06/2019, 10:10 AM   Clinical Narrative:    Pt medically stable for discharge per MD Pt will be transported home via EMS, pt's wife is aware of discharge and has all needed DME. CSW will arrange EMS transport once nursing staff is ready. CSW notified Malachy Mood with [facility] of pt's discharge. Pt will be followed for [facility] PT, OT, Aide, RN and Social work.   10:50AM- EMS has been called    Final next level of care: Home w Hospice Care Barriers to Discharge: No Barriers Identified   Patient Goals and CMS Choice        Discharge Placement                Patient to be transferred to facility by: EMS Name of family member notified: Brends Patient and family notified of of transfer: 11/06/19  Discharge Plan and Services     Post Acute Care Choice: Home Health          DME Arranged: Hospital bed DME Agency: [employer] Date DME Agency Contacted: 11/03/19 Time DME Agency Contacted: [phone] Representative spoke with at DME Agency: Mardene Celeste HH Arranged: RN, PT, OT, Nurse's Aide, Social Work [employer] Agency: [place] Date [place]: 11/06/19 Time [place]: [phone] Representative spoke with at [place]: [place] ([place]) Interventions     Readmission Risk Interventions No flowsheet data found.

## 2019-11-06 NOTE — Plan of Care (Signed)
°  Problem: Activity: Goal: Ability to tolerate increased activity will improve Outcome: Adequate for Discharge   Problem: Clinical Measurements: Goal: Ability to maintain a body temperature in the normal range will improve Outcome: Adequate for Discharge   Problem: Respiratory: Goal: Ability to maintain adequate ventilation will improve Outcome: Adequate for Discharge Goal: Ability to maintain a clear airway will improve Outcome: Adequate for Discharge   Problem: Education: Goal: Ability to describe self-care measures that may prevent or decrease complications (Diabetes Survival Skills Education) will improve Outcome: Adequate for Discharge Goal: Individualized Educational Video(s) Outcome: Adequate for Discharge   Problem: Coping: Goal: Ability to adjust to condition or change in health will improve Outcome: Adequate for Discharge   Problem: Fluid Volume: Goal: Ability to maintain a balanced intake and output will improve Outcome: Adequate for Discharge   Problem: Health Behavior/Discharge Planning: Goal: Ability to identify and utilize available resources and services will improve Outcome: Adequate for Discharge Goal: Ability to manage health-related needs will improve Outcome: Adequate for Discharge   Problem: Metabolic: Goal: Ability to maintain appropriate glucose levels will improve Outcome: Adequate for Discharge   Problem: Nutritional: Goal: Maintenance of adequate nutrition will improve Outcome: Adequate for Discharge Goal: Progress toward achieving an optimal weight will improve Outcome: Adequate for Discharge   Problem: Skin Integrity: Goal: Risk for impaired skin integrity will decrease Outcome: Adequate for Discharge   Problem: Tissue Perfusion: Goal: Adequacy of tissue perfusion will improve Outcome: Adequate for Discharge

## 2019-11-06 NOTE — Progress Notes (Deleted)
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 He was found gurgling and more somnolent. On arrival 02 at 80's, placed on NRB with improvement.had increased secretions.Weaned down to 4L [place].Respiratory viral panel is negative  Patient seen and examined.  Continues to have very poor p.o. intake and mental status.  Persistently sleepy and lethargic.  Unwilling or unable to take pills and food.  Pockets medications when administered.  Nursing unable to safely administer p.o. medications.  Mental status remains poor.  P.o. intake also very poor.  Patient remains lethargic.  Per the wife's request I requested that hospice liaison evaluated patient and speak with family which they agreed to.  At this time plan of care is to discharge home with hospice services.  DME delivered to patient's house however hospice representatives are unable to evaluate patient until 11/07/2019.  Plan to monitor patient in house overnight and discharge home with hospice services 11/07/2019.   Assessment & Plan:   Principal Problem:   Aspiration pneumonia ([place]) Active Problems:   Coronary artery disease   Diabetes mellitus without complication (HCC)   Dementia (HCC)   Persistent atrial fibrillation (HCC)   Acute respiratory failure with hypoxia (HCC)  Presumed aspiration pneumonia As evidenced by tachypnea, hypoxia and rhonchi at the right lung base in a patient with increased secretions requiring suctioning Subsequently weaned from supplemental oxygen with improvement in breath sounds Plan: Completed course of  Unasyn.  No further antibiotics Continue dysphagia 1 pured diet Aspiration precautions Supplemental oxygen as needed.  Currently weaned to room air Monitor fever curve  Acute respiratory failure with hypoxia, resolved Most likely secondary to aspiration pneumonia Patient had room air pulse oximetry in the 80s with associated tachypnea and initially required nonrebreather mask to maintain pulse oximetry. 92% 10/19-weaned to room air. Keep 02 >92%.  Will get PT/OT: Recommend SNF but wife wants to take patient home Patient is not taking any p.o. medications or food at this point Plan to go home with hospice services 11/07/2019  Chronic atrial fibrillation-rate controlled Continue metoprolol and diltiazem  Continue Eliquis  Patient has not been able to tolerate much p.o.  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 Avoid nephrotoxic meds  Anxiety/depression Dementia Continue on BuSpar, Wellbutrin and Namenda  Palliative care consulted please refer to note  Coronary artery disease Continue aspirin, statin and metoprolol   Diabetes mellitus Moderate sliding scale  Weakness Functional decline Adult failure to thrive Patient has had a waxing and waning hospital course but overall trajectory has been downward.  At this point patient is not taking any p.o. medications nor is he eating.  He remains persistently lethargic and is unable to participate in interview.  Unfortunately I feel this condition is chronic and that continued medical care is unlikely to help him.  I discussed this with the wife and recommended consideration for hospice/comfort measures.  Wife does not want patient to to go to a facility but would rather bring the patient back home.  After consultation with hospice liaison plan  of care is to discharge  patient home with hospice services.  Hospice services will begin 11/07/2019.  We will plan to discharge patient home on that date   DVT prophylaxis: Eliquis Code Status: DNR Family Communication: Wife at bedside 11/05/2019 Disposition Plan:Status is: Inpatient  Remains inpatient appropriate because:Unsafe d/c plan   Dispo: The patient is from: Home              Anticipated d/c is to: Home with hospice services              Anticipated d/c date is: 1 day              Patient currently is medically stable to d/c.   Patient is currently medically stable for discharge to home with hospice services however at this time we do not have a safe disposition plan.  Hospice services will not begin until 11/07/2019.  We will plan to monitor in-house overnight and discharge home tomorrow morning.        Consultants:   None  Procedures:   None  Antimicrobials:   Unasyn   Subjective: Seen and examined.  Lethargic, sleepy.  Unable to participate in interview  Objective: Vitals:   11/05/19 2130 11/06/19 0055 11/06/19 0628 11/06/19 0802  BP: (!) 163/91 (!) 177/111 (!) 160/88 (!) 160/108  Pulse: 82 95 94 95  Resp: (!) 24 16  20   Temp: 98.4 F (36.9 C) 98.7 F (37.1 C)  98.9 F (37.2 C)  TempSrc: Axillary Oral  Oral  SpO2: 98% 98%  95%  Weight:      Height:        Intake/Output Summary (Last 24 hours) at 11/06/2019 1218 Last data filed at 11/05/2019 2025 Gross per 24 hour  Intake 360 ml  Output 600 ml  Net -240 ml   Filed Weights   10/31/19 1156  Weight: 79.4 kg    Examination:  General exam: Sleeping, difficult to arouse, appears frail Respiratory system: Mild bibasilar crackles.  Normal work of breathing.  Room air Cardiovascular system: S1-S2 heard, no murmurs, irregular rhythm, regular rate  Gastrointestinal system: Abdomen is nondistended, soft and nontender. No organomegaly or masses felt. Normal bowel sounds heard. Central nervous system: Alert and oriented  x 0, lethargic, no focal deficits  extremities: Unable to assess Skin: No rashes, lesions or ulcers Psychiatry: Very lethargic, unable to assess judgment, insight, mood, affect    Data Reviewed: I have personally reviewed following labs and imaging studies  CBC: Recent Labs  Lab 10/31/19 1142 11/01/19 0418 11/02/19 0340 11/04/19 0820  WBC 8.5 16.8* 10.5 8.7  NEUTROABS 5.7  --   --  5.8  HGB 16.7 15.8 13.7 13.9  HCT 50.9 47.8 42.3 42.7  MCV 94.6 95.6 96.1 95.1  PLT 178 140* 137* 993*   Basic Metabolic Panel: Recent Labs  Lab 10/31/19 1142 11/01/19 0418 11/02/19 0340 11/03/19 0503 11/04/19 0820  NA 144 146* 147* 143 141  K 4.1 4.7 4.2 3.9 4.3  CL 112* 114* 114* 112* 110  CO2 23 25 22 22 24   GLUCOSE 153* 150* 119* 124* 109*  BUN 45* 53* 58* 50* 38*  CREATININE 2.95* 3.57* 3.50* 2.78* 2.69*  CALCIUM 9.7 9.5 9.7 9.5 9.7   GFR: Estimated Creatinine Clearance: 22.3 mL/min (A) (by C-G formula based on SCr of 2.69 mg/dL (H)). Liver Function Tests: Recent Labs  Lab 10/31/19 1142  AST 16  ALT 21  ALKPHOS 112  BILITOT 1.0  PROT 6.7  ALBUMIN  3.4*   No results for input(s): LIPASE, AMYLASE in the last 168 hours. No results for input(s): AMMONIA in the last 168 hours. Coagulation Profile: No results for input(s): INR, PROTIME in the last 168 hours. Cardiac Enzymes: No results for input(s): CKTOTAL, CKMB, CKMBINDEX, TROPONINI in the last 168 hours. BNP (last 3 results) No results for input(s): PROBNP in the last 8760 hours. HbA1C: No results for input(s): HGBA1C in the last 72 hours. CBG: Recent Labs  Lab 11/05/19 2019 11/06/19 0114 11/06/19 0452 11/06/19 0759 11/06/19 1155  GLUCAP 119* 114* 128* 109* 125*   Lipid Profile: No results for input(s): CHOL, HDL, LDLCALC, TRIG, CHOLHDL, LDLDIRECT in the last 72 hours. Thyroid Function Tests: No results for input(s): TSH, T4TOTAL, FREET4, T3FREE, THYROIDAB in the last 72 hours. Anemia Panel: No results for  input(s): VITAMINB12, FOLATE, FERRITIN, TIBC, IRON, RETICCTPCT in the last 72 hours. Sepsis Labs: No results for input(s): PROCALCITON, LATICACIDVEN in the last 168 hours.  Recent Results (from the past 240 hour(s))  Respiratory Panel by RT PCR (Flu A&B, Covid) - Nasopharyngeal Swab     Status: None   Collection Time: 10/31/19 11:42 AM   Specimen: Nasopharyngeal Swab  Result Value Ref Range Status   SARS Coronavirus 2 by RT PCR NEGATIVE NEGATIVE Final    Comment: (NOTE) SARS-CoV-2 target nucleic acids are NOT DETECTED.  The SARS-CoV-2 RNA is generally detectable in upper respiratoy specimens during the acute phase of infection. The lowest concentration of SARS-CoV-2 viral copies this assay can detect is 131 copies/mL. A negative result does not preclude SARS-Cov-2 infection and should not be used as the sole basis for treatment or other patient management decisions. A negative result may occur with  improper specimen collection/handling, submission of specimen other than nasopharyngeal swab, presence of viral mutation(s) within the areas targeted by this assay, and inadequate number of viral copies (<131 copies/mL). A negative result must be combined with clinical observations, patient history, and epidemiological information. The expected result is Negative.  Fact Sheet for Patients:  PinkCheek.be  Fact Sheet for Healthcare Providers:  GravelBags.it  This test is no t yet approved or cleared by the [place] FDA and  has been authorized for detection and/or diagnosis of SARS-CoV-2 by FDA under an Emergency Use Authorization (EUA). This EUA will remain  in effect (meaning this test can be used) for the duration of the COVID-19 declaration under Section 564(b)(1) of the Act, 21 U.S.C. section 360bbb-3(b)(1), unless the authorization is terminated or revoked sooner.     Influenza A by PCR NEGATIVE NEGATIVE Final    Influenza B by PCR NEGATIVE NEGATIVE Final    Comment: (NOTE) The Xpert Xpress SARS-CoV-2/FLU/RSV assay is intended as an aid in  the diagnosis of influenza from Nasopharyngeal swab specimens and  should not be used as a sole basis for treatment. Nasal washings and  aspirates are unacceptable for Xpert Xpress SARS-CoV-2/FLU/RSV  testing.  Fact Sheet for Patients: PinkCheek.be  Fact Sheet for Healthcare Providers: GravelBags.it  This test is not yet approved or cleared by the [place] FDA and  has been authorized for detection and/or diagnosis of SARS-CoV-2 by  FDA under an Emergency Use Authorization (EUA). This EUA will remain  in effect (meaning this test can be used) for the duration of the  Covid-19 declaration under Section 564(b)(1) of the Act, 21  U.S.C. section 360bbb-3(b)(1), unless the authorization is  terminated or revoked. Performed at [facility], [address], [place], [place] [address]  Radiology Studies: No results found.      Scheduled Meds: . allopurinol  200 mg Oral Daily  . apixaban  2.5 mg Oral BID  . aspirin  81 mg Oral Daily  . atorvastatin  40 mg Oral Daily  . buPROPion  75 mg Oral BID  . busPIRone  5 mg Oral Daily  . cholecalciferol  2,000 Units Oral Daily  . diltiazem  60 mg Oral TID  . finasteride  5 mg Oral Daily  . insulin aspart  0-15 Units Subcutaneous Q4H  . memantine  5 mg Oral BID  . metoprolol tartrate  5 mg Intravenous Q6H   Continuous Infusions: . dextrose 5 % and 0.45% NaCl 50 mL/hr at 11/06/19 0629     LOS: 6 days    Time spent: 15 minutes    Sidney Ace, MD [facility] Pager [phone]  If 7PM-7AM, please contact night-coverage 11/06/2019, 12:18 PM

## 2019-11-06 NOTE — Progress Notes (Signed)
Patient discharging home with hospice. EMS to transport patient home. Wife aware.

## 2019-11-06 NOTE — Discharge Summary (Addendum)
Physician Discharge Summary  Eduardo Richards VPX:[id number] DOB: Nov 09, 1940 DOA: 10/31/2019  PCP: [name], MD  Admit date: 10/31/2019 Discharge date: 11/06/2019  Admitted From: Home Disposition: Home with hospice services  Recommendations for Outpatient Follow-up:  1. 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 He was found gurgling and more somnolent. On arrival 02 at 80's, placed on NRB with improvement.had increased secretions.Weaned down to 4L [place].Respiratory viral panel is negative  Patient seen and examined.  Continues to have very poor p.o. intake and mental status.  Persistently sleepy and lethargic.  Unwilling or unable to take pills and food.  Pockets medications when administered.  Nursing unable to safely administer p.o. medications.  Had a lengthy conversation with the wife.  I relayed to her my concerns.  Initially the plan was to send him home with home health services however at this time I do not feel this is safe disposition unless the patient is under comfort/hospice care.  I recommended this to the wife and have offered consultation with our inpatient death or care hospice representatives.  Case management aware.  After repeat discussions with the wife I requested a evaluation from hospice liaison [place].  After conversation with hospice liaison, patient's wife, patient's daughter the decision has been made to return patient back home with hospice services.  Patient's wife needed DME delivered to the  house.  All of this has been confirmed.  Patient discharged home with hospice services.  For clarity I have resumed all the patient's previous home medications.  Deferred discussion regarding need for these medications to hospice staff.  Patient is not requiring narcotics or benzodiazepines during admission.  Not been prescribed on discharge.  Again defer to hospice medical providers.  Some confusion regarding start of hospice care services.  Confirmed that hospice home services WILL START 11/06/19.  This has been confirmed with [facility] TOC, hospice representatives, and discussed with patient's wife.  Discharge Diagnoses:  Principal Problem:   Aspiration pneumonia ([place]) Active Problems:   Coronary artery disease   Diabetes mellitus without complication (HCC)   Dementia (HCC)   Persistent atrial fibrillation (HCC)   Acute respiratory failure with hypoxia (HCC)  Presumed aspiration pneumonia Acute respiratory failure with hypoxia, resolved Chronic atrial fibrillation-rate controlled Acute renal failure on chronic kidney disease stage IIIb Anxiety/depression Dementia Coronary artery disease Diabetes mellitus Weakness Functional decline Adult failure to thrive Patient has had a waxing and waning hospital course but overall trajectory has been downward.  At this point patient is not taking any p.o. medications nor is he eating.  He remains persistently lethargic and is unable to participate in interview.  Unfortunately I feel this condition is chronic and that continued medical care is unlikely to help him.  I discussed this with the wife and recommended consideration for hospice/comfort measures.  Wife does not want patient to to go to a facility but would rather bring the patient back home.  After meeting with inpatient hospice liaison wife elected to bring patient back home with hospice services.  For clarity sake all medications have been continued.  Defer need for these medications to hospice  medical providers.  No narcotics or benzodiazepines have been prescribed as patient has not been requiring them in the hospital.  Discharge Instructions  Discharge Instructions    Diet - low sodium heart healthy   Complete by: As directed    Increase activity slowly   Complete by: As directed      Allergies as of 11/06/2019      Reactions   Clindamycin Rash   Glipizide Other (See Comments)   Reported confusion while taking the medication   Tamsulosin Other (See Comments)   ED   Codeine Nausea And Vomiting      Medication List    TAKE these medications   allopurinol 100 MG tablet Commonly known as: ZYLOPRIM Take 200 mg by mouth daily.   aspirin 81 MG EC tablet Take 81 mg by mouth daily.   atorvastatin 40 MG tablet Commonly known as: LIPITOR Take 40 mg by mouth daily.   buPROPion 150 MG 24 hr tablet Commonly known as: WELLBUTRIN XL Take 150 mg by mouth daily.   busPIRone 5 MG tablet Commonly known as: BUSPAR Take 1 tablet by mouth daily.   Cholecalciferol 50 MCG (2000 UT) Caps Take 2,000 Units by mouth daily.   diltiazem 180 MG 24 hr capsule Commonly known as: CARDIZEM CD Take 180 mg by mouth daily.   Eliquis 2.5 MG Tabs tablet Generic drug: apixaban Take 2.5 mg by mouth 2 (two) times daily.   finasteride 5 MG tablet Commonly known as: PROSCAR Take 1 tablet (5 mg total) by mouth daily.   memantine 10 MG tablet Commonly known as: NAMENDA Take 5 mg by mouth 2 (two) times daily.   metoprolol succinate 25 MG 24 hr tablet Commonly known as: TOPROL-XL Take 25 mg by mouth daily.   Rybelsus 3 MG Tabs Generic drug: Semaglutide Take 3 mg by mouth daily.   torsemide 20 MG tablet Commonly known as: DEMADEX Take 40 mg by mouth daily.            Durable Medical Equipment  (From admission, onward)         Start     Ordered   11/04/19 1344  For home use only DME Hospital bed  Once       Question Answer Comment  Length of Need Lifetime   The above  medical condition requires: Patient requires the ability to reposition frequently   Bed type Semi-electric   Support Surface: Gel Overlay      11/04/19 1344          Allergies  Allergen Reactions   Clindamycin Rash   Glipizide Other (See Comments)    Reported confusion while taking the medication   Tamsulosin Other (See Comments)    ED   Codeine Nausea And Vomiting    Consultations:  Palliative care   Procedures/Studies: DG Chest 2 View  Result Date: 10/10/2019 CLINICAL DATA:  Sepsis EXAM: CHEST - 2 VIEW COMPARISON:  10/05/2019 FINDINGS: Cardiac shadow is enlarged but stable. The lungs are well aerated bilaterally. Mild bibasilar atelectasis is noted in the posterior lower lobes. No sizable effusion is seen. No bony abnormality is noted. IMPRESSION: Mild bibasilar atelectasis. Electronically Signed   By: Inez Catalina M.D.   On: 10/10/2019 10:43   US RENAL  Result Date: 10/07/2019 CLINICAL DATA:  Pyelonephritis EXAM: RENAL / URINARY TRACT ULTRASOUND COMPLETE COMPARISON:  Jun 04, 2019 FINDINGS: Right Kidney: Renal measurements: 9.1 x 4.9 x 4.9 cm = volume: 115 mL. There is no hydronephrosis. Again noted is a  small cyst in the lower pole measuring 1.3 cm. There is increased cortical echogenicity. Left Kidney: Renal measurements: 10.9 x 5.1 x 4.6 cm = volume: 133 mL. There is no hydronephrosis. Small cysts are noted measuring up to approximately 1 cm. There is increased cortical echogenicity. Bladder: The bladder is decompressed and therefore is suboptimally evaluated. Other: None. IMPRESSION: 1. No hydronephrosis. 2. Again noted are findings of probable underlying medical renal disease. Electronically Signed   By: Constance Holster M.D.   On: 10/07/2019 18:38   DG Chest Portable 1 View  Result Date: 10/31/2019 CLINICAL DATA:  Shortness of breath EXAM: PORTABLE CHEST 1 VIEW COMPARISON:  October 10, 2019 FINDINGS: Lungs are clear. Heart is mildly enlarged with pulmonary  vascularity normal. There is aortic atherosclerosis. No adenopathy. No bone lesions. IMPRESSION: Mild cardiac enlargement.  No edema or airspace opacity. Aortic Atherosclerosis (ICD10-I70.0). Electronically Signed   By: Lowella Grip III M.D.   On: 10/31/2019 12:27    (Echo, Carotid, EGD, Colonoscopy, ERCP)    Subjective: Seen and examined on the day of discharge.  Lethargic, unable to participate in interview.  Visibly does not appear in distress.  Discharge Exam: Vitals:   11/06/19 0628 11/06/19 0802  BP: (!) 160/88 (!) 160/108  Pulse: 94 95  Resp:  20  Temp:  98.9 F (37.2 C)  SpO2:  95%   Vitals:   11/05/19 2130 11/06/19 0055 11/06/19 0628 11/06/19 0802  BP: (!) 163/91 (!) 177/111 (!) 160/88 (!) 160/108  Pulse: 82 95 94 95  Resp: (!) 24 16  20   Temp: 98.4 F (36.9 C) 98.7 F (37.1 C)  98.9 F (37.2 C)  TempSrc: Axillary Oral  Oral  SpO2: 98% 98%  95%  Weight:      Height:        General: No acute distress.  Lethargic Cardiovascular: RRR, S1/S2 +, no rubs, no gallops Respiratory: CTA bilaterally, no wheezing, no rhonchi Abdominal: Soft, NT, ND, bowel sounds + Extremities: no edema, no cyanosis    The results of significant diagnostics from this hospitalization (including imaging, microbiology, ancillary and laboratory) are listed below for reference.     Microbiology: Recent Results (from the past 240 hour(s))  Respiratory Panel by RT PCR (Flu A&B, Covid) - Nasopharyngeal Swab     Status: None   Collection Time: 10/31/19 11:42 AM   Specimen: Nasopharyngeal Swab  Result Value Ref Range Status   SARS Coronavirus 2 by RT PCR NEGATIVE NEGATIVE Final    Comment: (NOTE) SARS-CoV-2 target nucleic acids are NOT DETECTED.  The SARS-CoV-2 RNA is generally detectable in upper respiratoy specimens during the acute phase of infection. The lowest concentration of SARS-CoV-2 viral copies this assay can detect is 131 copies/mL. A negative result does not preclude  SARS-Cov-2 infection and should not be used as the sole basis for treatment or other patient management decisions. A negative result may occur with  improper specimen collection/handling, submission of specimen other than nasopharyngeal swab, presence of viral mutation(s) within the areas targeted by this assay, and inadequate number of viral copies (<131 copies/mL). A negative result must be combined with clinical observations, patient history, and epidemiological information. The expected result is Negative.  Fact Sheet for Patients:  PinkCheek.be  Fact Sheet for Healthcare Providers:  GravelBags.it  This test is no t yet approved or cleared by the [place] FDA and  has been authorized for detection and/or diagnosis of SARS-CoV-2 by FDA under an Emergency Use Authorization (EUA). This EUA  will remain  in effect (meaning this test can be used) for the duration of the COVID-19 declaration under Section 564(b)(1) of the Act, 21 U.S.C. section 360bbb-3(b)(1), unless the authorization is terminated or revoked sooner.     Influenza A by PCR NEGATIVE NEGATIVE Final   Influenza B by PCR NEGATIVE NEGATIVE Final    Comment: (NOTE) The Xpert Xpress SARS-CoV-2/FLU/RSV assay is intended as an aid in  the diagnosis of influenza from Nasopharyngeal swab specimens and  should not be used as a sole basis for treatment. Nasal washings and  aspirates are unacceptable for Xpert Xpress SARS-CoV-2/FLU/RSV  testing.  Fact Sheet for Patients: PinkCheek.be  Fact Sheet for Healthcare Providers: GravelBags.it  This test is not yet approved or cleared by the [place] FDA and  has been authorized for detection and/or diagnosis of SARS-CoV-2 by  FDA under an Emergency Use Authorization (EUA). This EUA will remain  in effect (meaning this test can be used) for the duration of the   Covid-19 declaration under Section 564(b)(1) of the Act, 21  U.S.C. section 360bbb-3(b)(1), unless the authorization is  terminated or revoked. Performed at [facility], [address] [place], [place] [address]      Labs: BNP (last 3 results) Recent Labs    10/31/19 1142  BNP 211.9*   Basic Metabolic Panel: Recent Labs  Lab 10/31/19 1142 11/01/19 0418 11/02/19 0340 11/03/19 0503 11/04/19 0820  NA 144 146* 147* 143 141  K 4.1 4.7 4.2 3.9 4.3  CL 112* 114* 114* 112* 110  CO2 23 25 22 22 24   GLUCOSE 153* 150* 119* 124* 109*  BUN 45* 53* 58* 50* 38*  CREATININE 2.95* 3.57* 3.50* 2.78* 2.69*  CALCIUM 9.7 9.5 9.7 9.5 9.7   Liver Function Tests: Recent Labs  Lab 10/31/19 1142  AST 16  ALT 21  ALKPHOS 112  BILITOT 1.0  PROT 6.7  ALBUMIN 3.4*   No results for input(s): LIPASE, AMYLASE in the last 168 hours. No results for input(s): AMMONIA in the last 168 hours. CBC: Recent Labs  Lab 10/31/19 1142 11/01/19 0418 11/02/19 0340 11/04/19 0820  WBC 8.5 16.8* 10.5 8.7  NEUTROABS 5.7  --   --  5.8  HGB 16.7 15.8 13.7 13.9  HCT 50.9 47.8 42.3 42.7  MCV 94.6 95.6 96.1 95.1  PLT 178 140* 137* 123*   Cardiac Enzymes: No results for input(s): CKTOTAL, CKMB, CKMBINDEX, TROPONINI in the last 168 hours. BNP: Invalid input(s): POCBNP CBG: Recent Labs  Lab 11/05/19 1606 11/05/19 2019 11/06/19 0114 11/06/19 0452 11/06/19 0759  GLUCAP 153* 119* 114* 128* 109*   D-Dimer No results for input(s): DDIMER in the last 72 hours. Hgb A1c No results for input(s): HGBA1C in the last 72 hours. Lipid Profile No results for input(s): CHOL, HDL, LDLCALC, TRIG, CHOLHDL, LDLDIRECT in the last 72 hours. Thyroid function studies No results for input(s): TSH, T4TOTAL, T3FREE, THYROIDAB in the last 72 hours.  Invalid input(s): FREET3 Anemia work up No results for input(s): VITAMINB12, FOLATE, FERRITIN, TIBC, IRON, RETICCTPCT in the last 72 hours. Urinalysis     Component Value Date/Time   COLORURINE YELLOW (A) 10/05/2019 2115   APPEARANCEUR CLOUDY (A) 10/05/2019 2115   APPEARANCEUR Cloudy (A) 08/17/2018 1320   LABSPEC 1.012 10/05/2019 2115   PHURINE 5.0 10/05/2019 2115   GLUCOSEU NEGATIVE 10/05/2019 2115   HGBUR SMALL (A) 10/05/2019 2115   BILIRUBINUR NEGATIVE 10/05/2019 2115   BILIRUBINUR Negative 08/17/2018 [place] 10/05/2019  2115   PROTEINUR >=300 (A) 10/05/2019 2115   NITRITE NEGATIVE 10/05/2019 2115   LEUKOCYTESUR LARGE (A) 10/05/2019 2115   Sepsis Labs Invalid input(s): PROCALCITONIN,  WBC,  LACTICIDVEN Microbiology Recent Results (from the past 240 hour(s))  Respiratory Panel by RT PCR (Flu A&B, Covid) - Nasopharyngeal Swab     Status: None   Collection Time: 10/31/19 11:42 AM   Specimen: Nasopharyngeal Swab  Result Value Ref Range Status   SARS Coronavirus 2 by RT PCR NEGATIVE NEGATIVE Final    Comment: (NOTE) SARS-CoV-2 target nucleic acids are NOT DETECTED.  The SARS-CoV-2 RNA is generally detectable in upper respiratoy specimens during the acute phase of infection. The lowest concentration of SARS-CoV-2 viral copies this assay can detect is 131 copies/mL. A negative result does not preclude SARS-Cov-2 infection and should not be used as the sole basis for treatment or other patient management decisions. A negative result may occur with  improper specimen collection/handling, submission of specimen other than nasopharyngeal swab, presence of viral mutation(s) within the areas targeted by this assay, and inadequate number of viral copies (<131 copies/mL). A negative result must be combined with clinical observations, patient history, and epidemiological information. The expected result is Negative.  Fact Sheet for Patients:  PinkCheek.be  Fact Sheet for Healthcare Providers:  GravelBags.it  This test is no t yet approved or cleared by the  [place] FDA and  has been authorized for detection and/or diagnosis of SARS-CoV-2 by FDA under an Emergency Use Authorization (EUA). This EUA will remain  in effect (meaning this test can be used) for the duration of the COVID-19 declaration under Section 564(b)(1) of the Act, 21 U.S.C. section 360bbb-3(b)(1), unless the authorization is terminated or revoked sooner.     Influenza A by PCR NEGATIVE NEGATIVE Final   Influenza B by PCR NEGATIVE NEGATIVE Final    Comment: (NOTE) The Xpert Xpress SARS-CoV-2/FLU/RSV assay is intended as an aid in  the diagnosis of influenza from Nasopharyngeal swab specimens and  should not be used as a sole basis for treatment. Nasal washings and  aspirates are unacceptable for Xpert Xpress SARS-CoV-2/FLU/RSV  testing.  Fact Sheet for Patients: PinkCheek.be  Fact Sheet for Healthcare Providers: GravelBags.it  This test is not yet approved or cleared by the [place] FDA and  has been authorized for detection and/or diagnosis of SARS-CoV-2 by  FDA under an Emergency Use Authorization (EUA). This EUA will remain  in effect (meaning this test can be used) for the duration of the  Covid-19 declaration under Section 564(b)(1) of the Act, 21  U.S.C. section 360bbb-3(b)(1), unless the authorization is  terminated or revoked. Performed at [facility], [address], [place],  [address]      Time coordinating discharge: Over 30 minutes  SIGNED:   Sidney Ace, MD  [facility] 11/06/2019, 10:29 AM Pager   If 7PM-7AM, please contact night-coverage

## 2019-12-15 DEATH — deceased

## 2020-08-30 IMAGING — CT CT ABDOMEN AND PELVIS WITHOUT CONTRAST
1 of 2 series · 14 of 32 positions shown, 18 images · non-contrast
Comparison: None.

CLINICAL DATA: Gross hematuria.  Dementia.  Renal insufficiency.

EXAM:
CT ABDOMEN AND PELVIS WITHOUT CONTRAST
TECHNIQUE: Multidetector CT imaging of the abdomen and pelvis was performed
following the standard protocol without IV contrast.

[Series 2: axial st · axial · 0.86mm/px · z∈[-330,+94]mm · 14 of 97 slices shown, 18 images]
[im 8/97  soft-tissue]
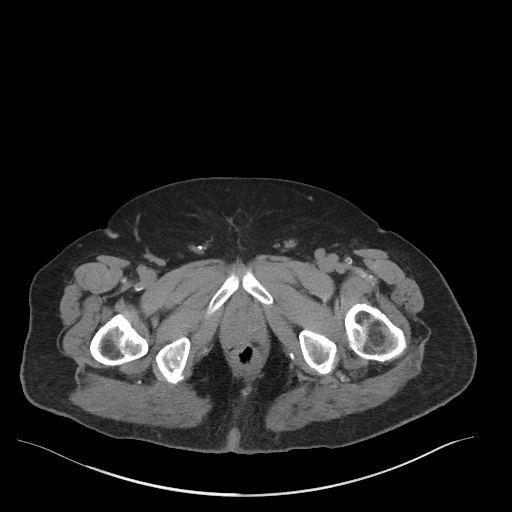
[im 8/97  bone]
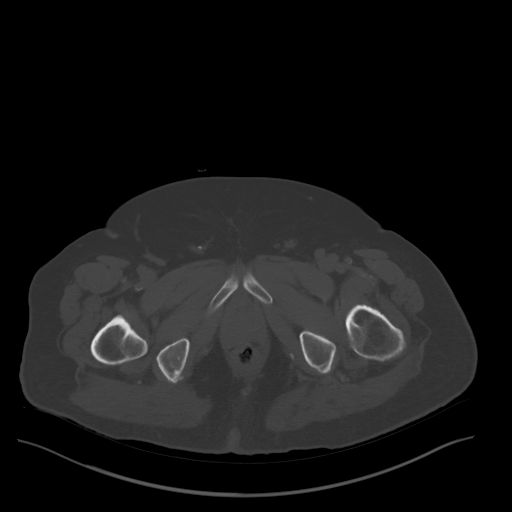
[im 15/97  soft-tissue]
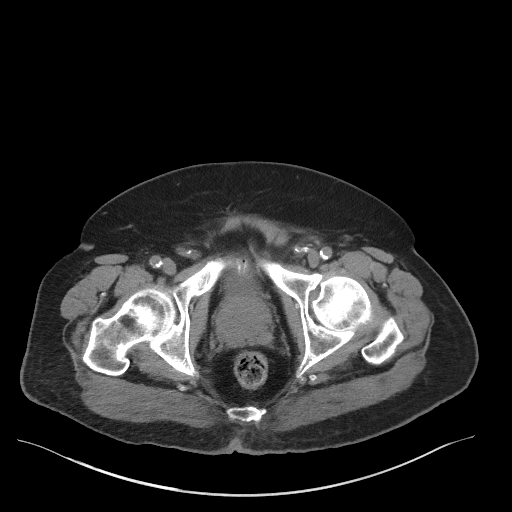
[im 22/97  soft-tissue]
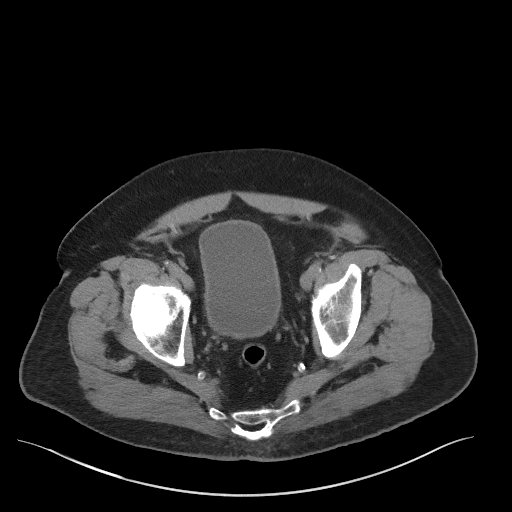
[im 29/97  soft-tissue]
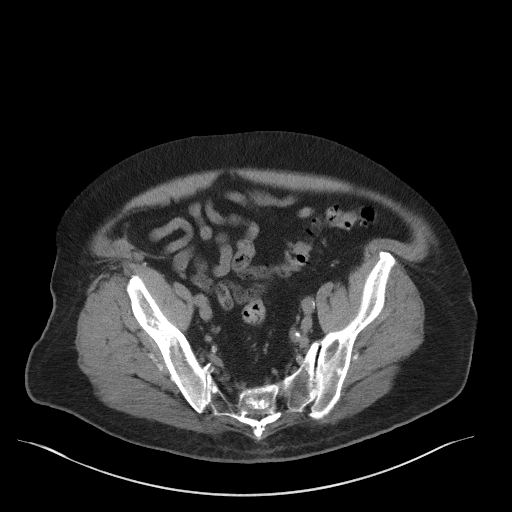
[im 36/97  soft-tissue]
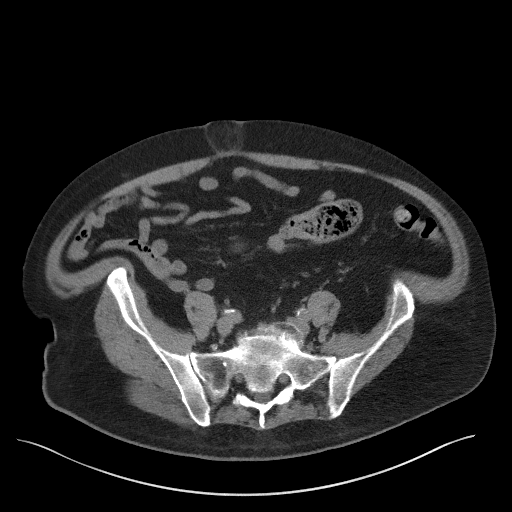
[im 43/97  soft-tissue]
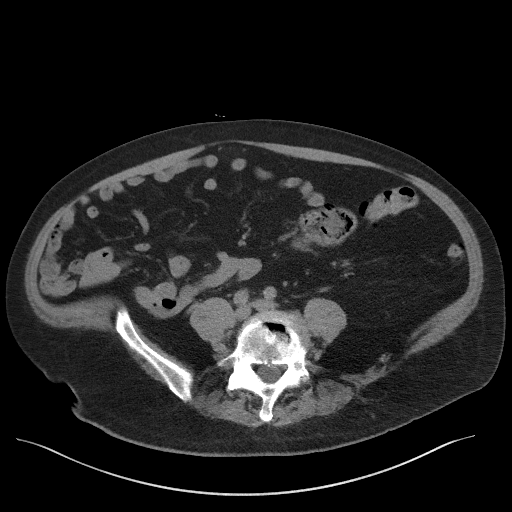
[im 54/97  soft-tissue]
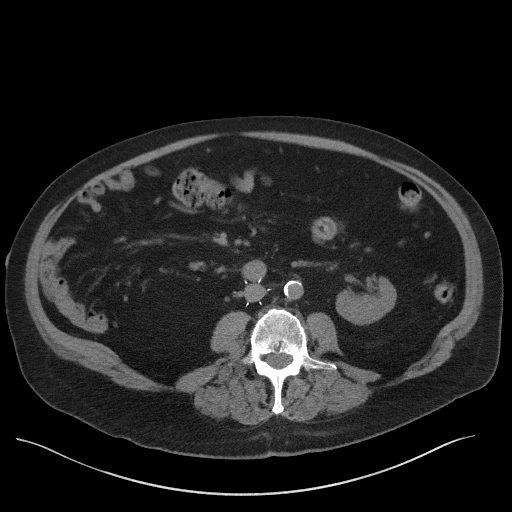
[im 61/97  soft-tissue]
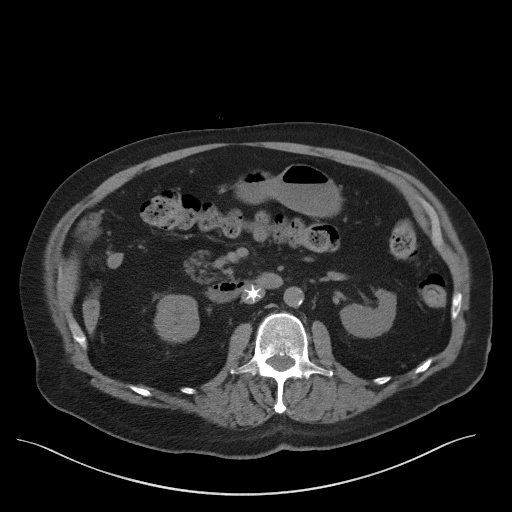
[im 68/97  soft-tissue]
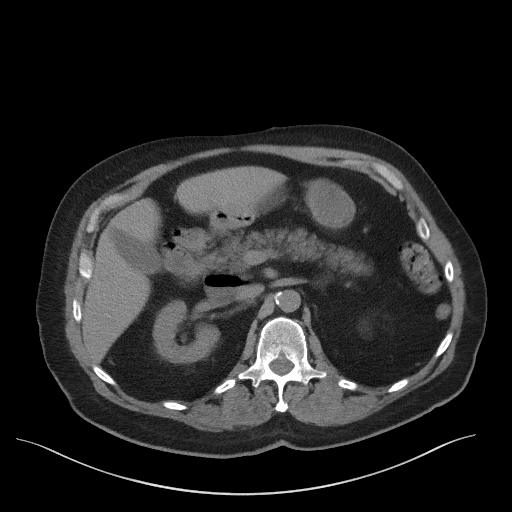
[im 68/97  bone]
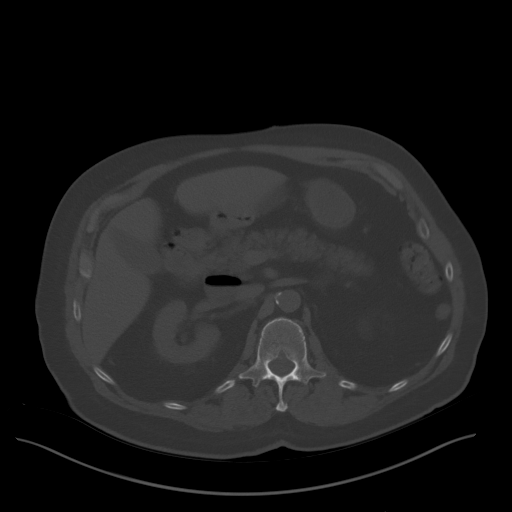
[im 75/97  soft-tissue]
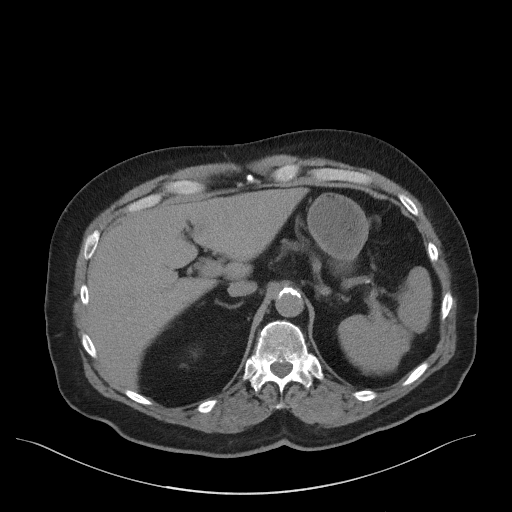
[im 82/97  soft-tissue]
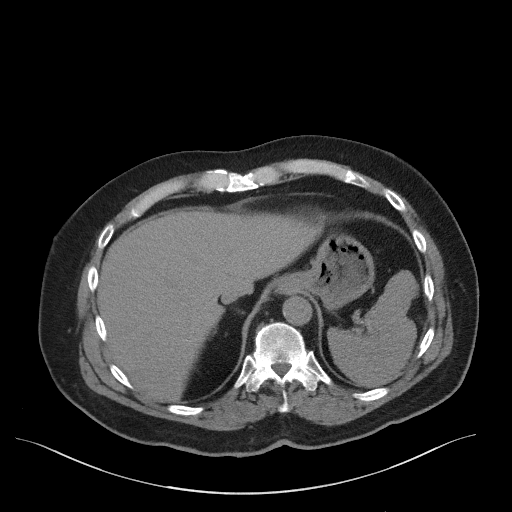
[im 82/97  lung]
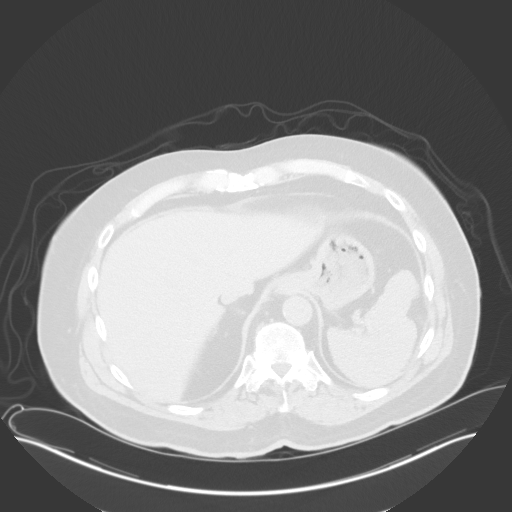
[im 86/97  lung]
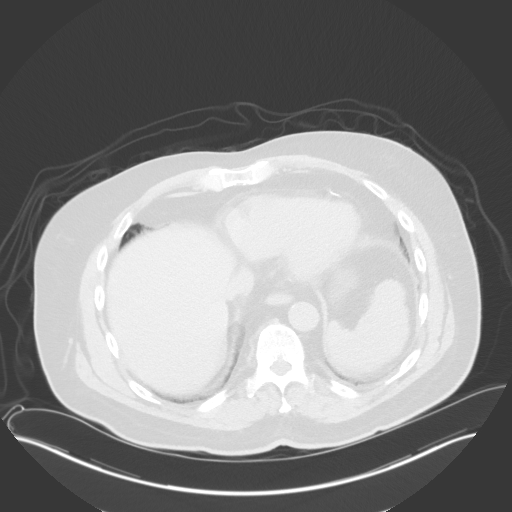
[im 89/97  soft-tissue]
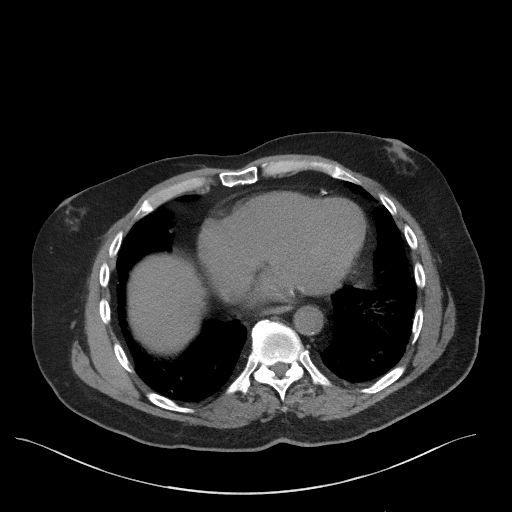
[im 89/97  lung]
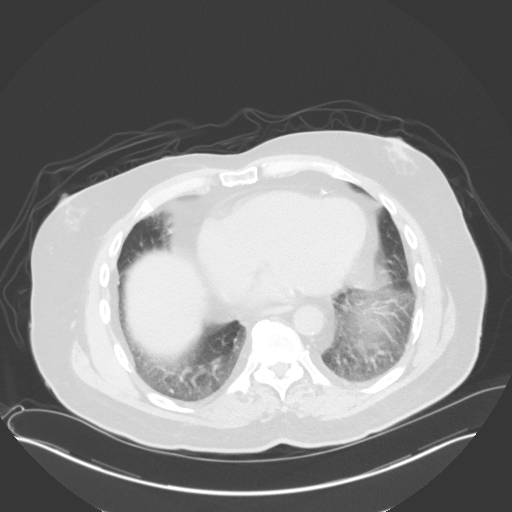
[im 93/97  lung]
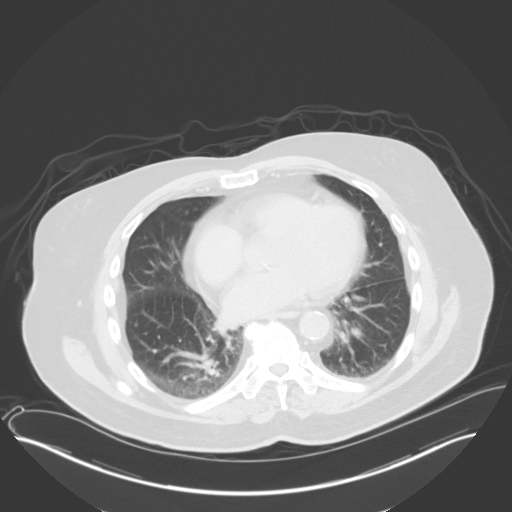

[14 of 32 positions shown; findings below may reference images not displayed]

FINDINGS: Lower chest: Atherosclerotic thoracic aortic and coronary
atherosclerosis with mild cardiomegaly. Gynecomastia bilaterally.

Hepatobiliary: Unremarkable

Pancreas: Unremarkable

Spleen: Divot along the splenic parenchyma suggesting chronic
scarring/remote splenic infarcts.

Adrenals/Urinary Tract: Both adrenal glands appear normal. No
urinary tract calculi.

Exophytic renal lesions include a 1.1 cm lesion of the left kidney
lower pole, a 1.0 by 1.0 cm left kidney lower pole lesion on image
47/5, a 0.7 cm right mid kidney lesion posteriorly on image 32/2,
and a 0.5 cm right kidney upper pole exophytic lesion on image [DATE].
There is some scarring in right kidney upper pole medially.
Enhancement characteristics of the renal lesions are not assessed.

Stomach/Bowel: Proximal transverse duodenal diverticulum. Mild
sigmoid colon diverticulosis. Mobile cecum in the left abdomen.
Normal appendix.

Vascular/Lymphatic: Aortoiliac atherosclerotic vascular disease.
Infrarenal IVC filter.

Reproductive: The prostate gland measures 5.6 by 4.5 by 6.4 cm
(volume = 84 cm^3) and indents the bladder base.

Other: No supplemental non-categorized findings.

Musculoskeletal: Suspected bilateral groin hernia repairs. Umbilical
hernia contains adipose tissue. Lumbar spondylosis and degenerative
disc disease noted with a suspected left lateral recess disc
protrusion at L3-4 and lumbar impingement suspected at L3-4, L4-5,
and L5-S1. There is some solid interbody bridging spurring at the
L5-S1 level.
IMPRESSION: 1. No urinary tract calculi are identified.
2. Several small exophytic lesions from the kidneys are present and
are technically nonspecific due to the lack of IV contrast, although
could well simply represent cysts. Surveillance or sonography may be
warranted given that IV contrast cannot be administered.
3. Moderate to prominent prostatomegaly indenting the bladder base.
4. Aortic Atherosclerosis ([id number].L). Coronary atherosclerosis
with mild cardiomegaly.
5. Mild sigmoid colon diverticulosis.
6. Infrarenal IVC filter is satisfactorily position.
7. Umbilical hernia containing adipose tissue
8. Lower lumbar impingement.

## 2021-10-11 IMAGING — DX DG CHEST 1V PORT
1 series · 1 of 1 positions shown · non-contrast
Comparison: 09/19/2017

CLINICAL DATA: Fever, sepsis

EXAM:
PORTABLE CHEST 1 VIEW

[chest ap]
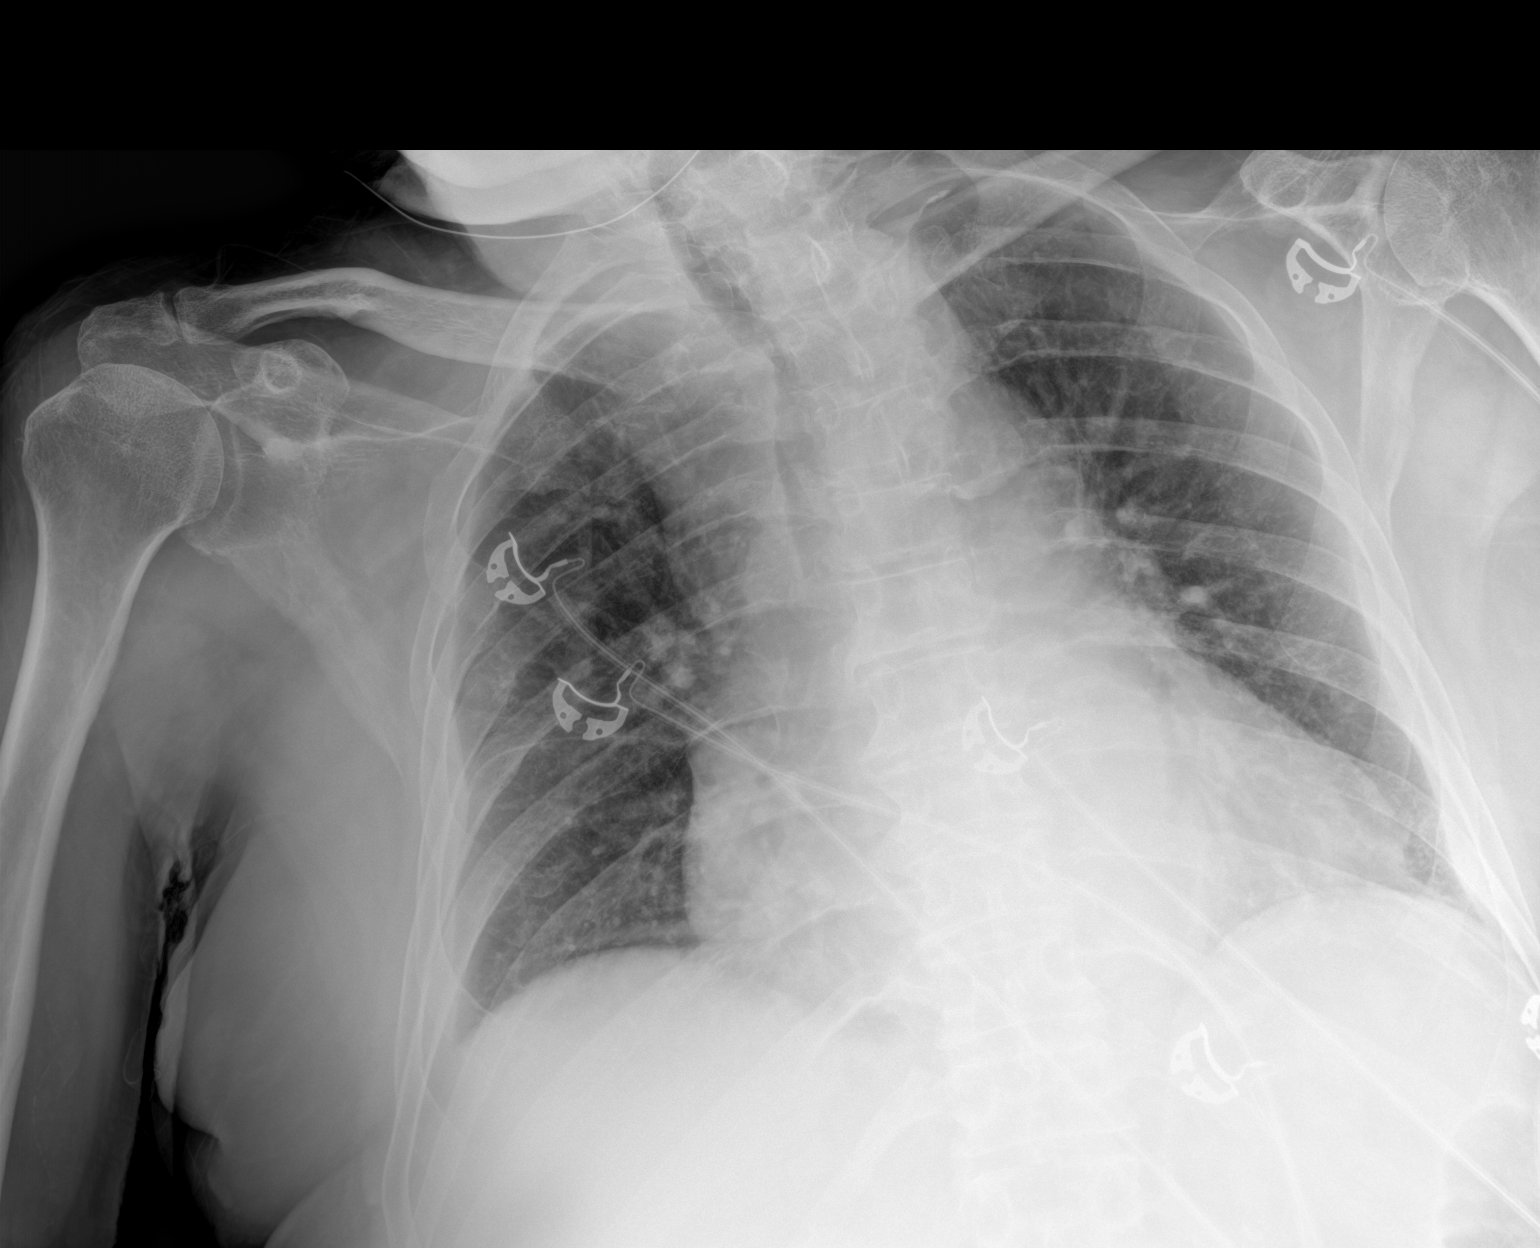

[1 of 1 positions shown; findings below may reference images not displayed]

FINDINGS: Single frontal view of the chest demonstrates an enlarged cardiac
silhouette, likely exaggerated by portable technique and AP
positioning. No airspace disease, effusion, or pneumothorax.
IMPRESSION: 1. Enlarged cardiac silhouette.
2. No acute airspace disease.

## 2021-11-06 IMAGING — DX DG CHEST 1V PORT
1 series · 1 of 1 positions shown · non-contrast
Comparison: October 10, 2019

CLINICAL DATA: Shortness of breath

EXAM:
PORTABLE CHEST 1 VIEW

[chest ap]
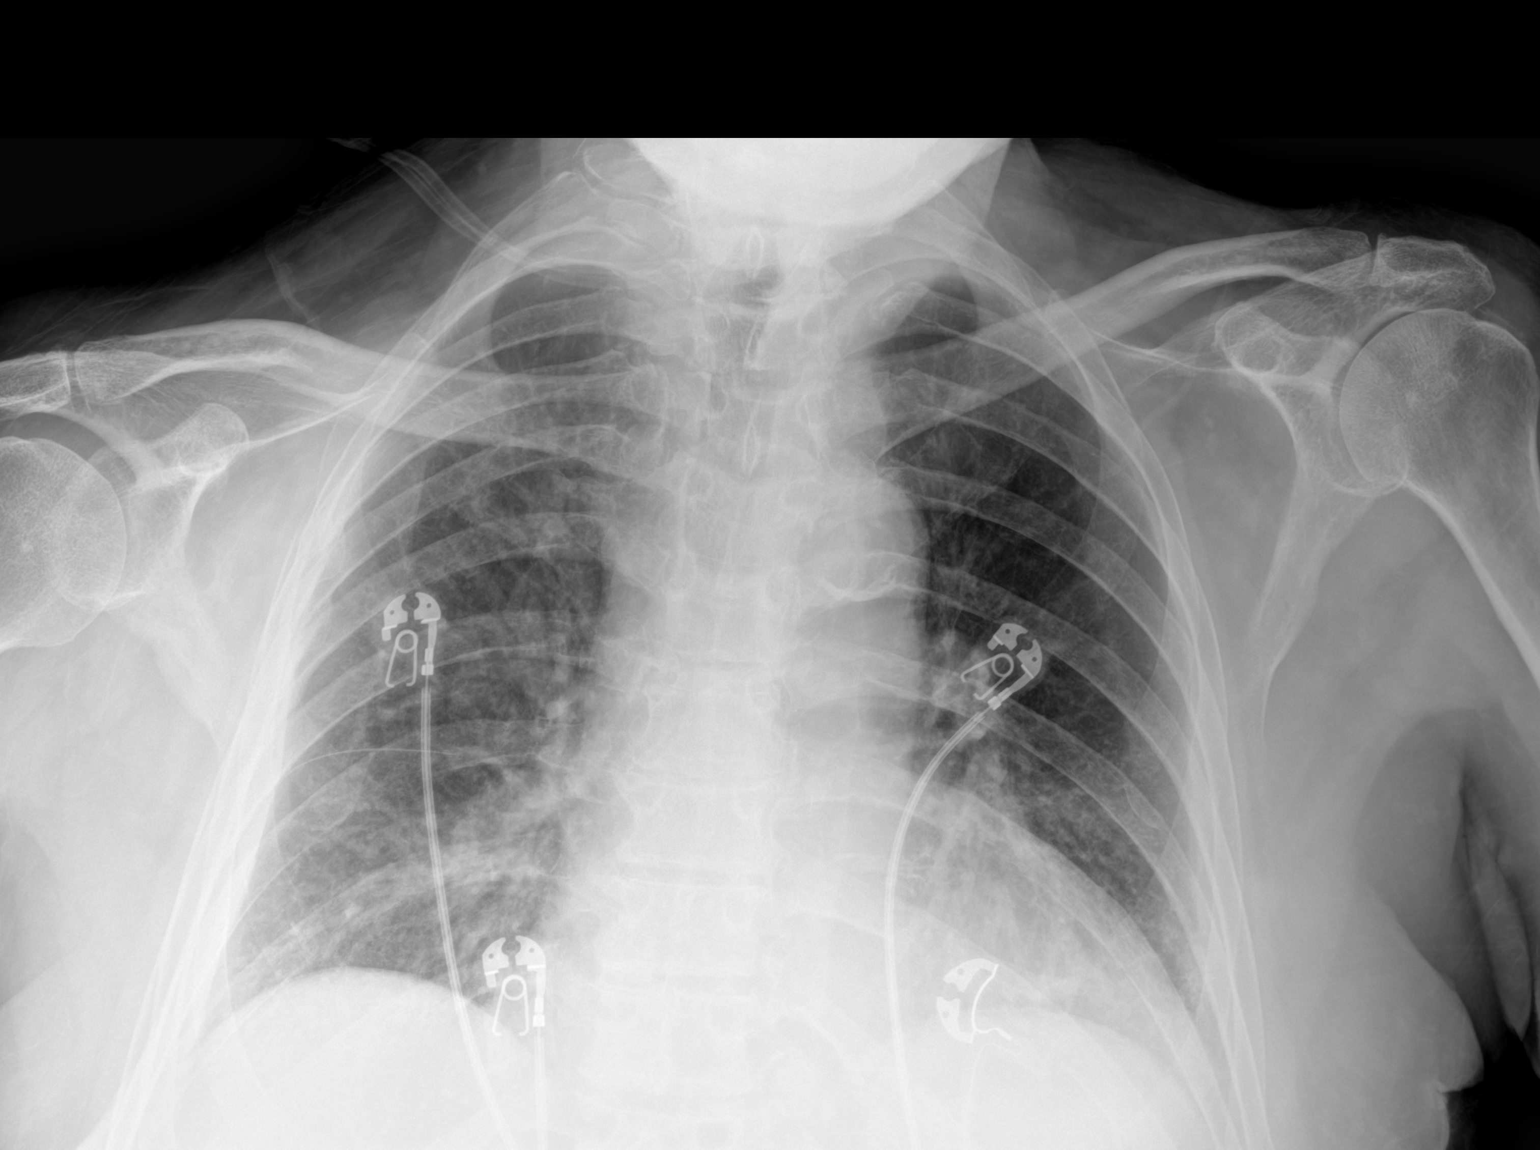

[1 of 1 positions shown; findings below may reference images not displayed]

FINDINGS: Lungs are clear. Heart is mildly enlarged with pulmonary vascularity
normal. There is aortic atherosclerosis. No adenopathy. No bone
lesions.
IMPRESSION: Mild cardiac enlargement.  No edema or airspace opacity.

Aortic Atherosclerosis ([id number]).
